# Patient Record
Sex: Male | Born: 1955
Health system: Southern US, Community
[De-identification: ages and names within clinical notes are randomized; demographics above are authoritative.]

## PROBLEM LIST (undated history)

## (undated) ENCOUNTER — Emergency Department (HOSPITAL_COMMUNITY): Admission: EM | Discharge status: Absent without leave | Payer: 59

## (undated) DIAGNOSIS — I10 Essential (primary) hypertension: Secondary | ICD-10-CM

## (undated) DIAGNOSIS — M109 Gout, unspecified: Secondary | ICD-10-CM

## (undated) DIAGNOSIS — T7840XA Allergy, unspecified, initial encounter: Secondary | ICD-10-CM

## (undated) DIAGNOSIS — E785 Hyperlipidemia, unspecified: Secondary | ICD-10-CM

## (undated) DIAGNOSIS — Z950 Presence of cardiac pacemaker: Secondary | ICD-10-CM

## (undated) HISTORY — DX: Gout, unspecified: M10.9

## (undated) HISTORY — PX: KNEE SURGERY: SHX244

## (undated) HISTORY — DX: Allergy, unspecified, initial encounter: T78.40XA

## (undated) HISTORY — DX: Essential (primary) hypertension: I10

## (undated) HISTORY — PX: ROTATOR CUFF REPAIR: SHX139

## (undated) HISTORY — DX: Hyperlipidemia, unspecified: E78.5

---

## 2003-07-20 ENCOUNTER — Emergency Department (HOSPITAL_COMMUNITY): Admission: EM | Admit: 2003-07-20 | Discharge: 2003-07-20 | Payer: Self-pay | Admitting: Emergency Medicine

## 2003-09-27 ENCOUNTER — Ambulatory Visit (HOSPITAL_COMMUNITY): Admission: RE | Admit: 2003-09-27 | Discharge: 2003-09-27 | Payer: Self-pay | Admitting: Orthopedic Surgery

## 2003-10-10 ENCOUNTER — Observation Stay (HOSPITAL_COMMUNITY): Admission: AD | Admit: 2003-10-10 | Discharge: 2003-10-11 | Payer: Self-pay | Admitting: Orthopedic Surgery

## 2008-01-19 HISTORY — PX: COLONOSCOPY: SHX174

## 2008-07-25 ENCOUNTER — Encounter: Payer: Self-pay | Admitting: Gastroenterology

## 2008-08-19 ENCOUNTER — Ambulatory Visit: Payer: Self-pay | Admitting: Gastroenterology

## 2008-09-02 ENCOUNTER — Ambulatory Visit: Payer: Self-pay | Admitting: Gastroenterology

## 2009-12-30 ENCOUNTER — Ambulatory Visit (HOSPITAL_COMMUNITY)
Admission: RE | Admit: 2009-12-30 | Discharge: 2009-12-30 | Payer: Self-pay | Source: Home / Self Care | Attending: Podiatry | Admitting: Podiatry

## 2010-06-05 NOTE — Op Note (Signed)
NAMEADEM, COSTLOW                ACCOUNT NO.:  [id number]   MEDICAL RECORD NO.:  [id number]          PATIENT TYPE:  OBV   LOCATION:  0476                         FACILITY:  [facility]   PHYSICIAN:  Almedia Balls. Ranell Patrick, M.D. DATE OF BIRTH:  1955/06/24   DATE OF PROCEDURE:  10/10/2003  DATE OF DISCHARGE:  10/11/2003                                 OPERATIVE REPORT   PREOPERATIVE DIAGNOSIS:  Left knee hemarthrosis following knee arthroscopy.   POSTOPERATIVE DIAGNOSIS:  Left knee hemarthrosis following knee arthroscopy.   PROCEDURE PERFORMED:  Arthrocentesis of the left knee with evacuation of  hematoma and hemarthrosis followed by placement of closed-suction drainage.   SURGEON:  Almedia Balls. Ranell Patrick, M.D.   ASSISTANT:  None.   ANESTHESIA:  Local plus MAC.   ESTIMATED BLOOD LOSS:  Minimal.   FLUID REPLACEMENT:  Crystalloid 400.   COUNTS:  Correct.   COMPLICATIONS:  None.   Perioperative antibiotics given.   INDICATIONS:  Patient is a 55 year old male status post left knee  arthroscopy for stiffness and adhesions as well as medial meniscal tear.  Patient underwent arthroscopy three days ago.  Patient presents with 24-hour  history of acute knee pain and swelling.  Attempted evacuation in the office  yielded evidence of blood in the joint; however, it seemed to be organizing  and clotting.  It was unable to be evacuated even with an 18 gauge needle.  After counseling the patient and his wife regarding his condition with a  hematoma in his knee and a flexed knee posture, we decided together to  proceed to surgery for an evacuation of his hematoma and placement of a  closed-suction drain.  Informed consent obtained.   DESCRIPTION OF PROCEDURE:  After an adequate level of anesthesia achieved,  the patient was positioned supine on the operating room table.  The left leg  was sterilely prepped and draped in the usual manner.  The superolateral  outflow port was utilized.  We placed a  large, blunt obturator with a scope  cannula into that portal and then evacuated large blood clots from the knee  after thorough evacuation and lavage using saline.  We had instilled  lidocaine, Marcaine, and Neut to assist with postop pain and then placed a  closed-suction drain and sutured the portal close and secured the drain with  a suture as well as a nylon stitch.  Sterile compressive dressing followed  by a knee immobilizer.  Patient taken to the recovery room in stable  condition.      SRN/MEDQ  D:  10/10/2003  T:  10/11/2003  Job:  [id number]

## 2010-06-05 NOTE — H&P (Signed)
Jose Cook, Jose Cook                ACCOUNT NO.:  [id number]   MEDICAL RECORD NO.:  [id number]         PATIENT TYPE:  LOBV   LOCATION:  476                          FACILITY:  [facility]   PHYSICIAN:  Almedia Balls. Ranell Patrick, M.D. DATE OF BIRTH:  09/18/1955   DATE OF ADMISSION:  10/10/2003  DATE OF DISCHARGE:                                HISTORY & PHYSICAL   CHIEF COMPLAINT:  Left knee pain.   HISTORY OF PRESENT ILLNESS:  The patient has extreme left knee pain, status  post a left knee arthroscopy performed several days ago.  He comes into the  office today with excruciating pain and an obvious hemarthrosis and effusion  of the left knee that needs incision and drainage.   ALLERGIES:  No known drug allergies.   MEDICATIONS:  1.  Percocet 5/325 mg.  2.  Robaxin 500 mg.   PAST MEDICAL HISTORY:  Negative.   PAST SURGICAL HISTORY:  Left knee arthroscopy.   FAMILY HISTORY:  Cancer.   REVIEW OF SYSTEMS:  Essentially negative.   PHYSICAL EXAMINATION:  GENERAL:  The patient is a 55 year old healthy-  appearing male in mild distress due to pain, but alert and oriented with a  pleasant mood and affect here in the office today.  Examination of all other  body systems was unremarkable.  EXTREMITIES:  Examination of the left knee shows a moderate hemarthrosis and  moderate effusion, pain with any type of range of motion or palpation.  Neurovascularly, he is intact.  Skin is intact distally.  He is unable to  bear any weight on that left knee.   IMPRESSION:  Left knee hemarthrosis.   PLAN:  Incision and drainage of the left hemarthrosis to be completed later  tonight by Dr. Malon Kindle at [facility].      ________________________________________  Donnie Coffin. Dixon, P.A.  ___________________________________________  Almedia Balls. Ranell Patrick, M.D.    TBD/MEDQ  D:  10/10/2003  T:  10/10/2003  Job:  [id number]

## 2011-01-28 ENCOUNTER — Ambulatory Visit (INDEPENDENT_AMBULATORY_CARE_PROVIDER_SITE_OTHER): Payer: 59

## 2011-01-28 DIAGNOSIS — M109 Gout, unspecified: Secondary | ICD-10-CM

## 2011-08-01 ENCOUNTER — Ambulatory Visit (INDEPENDENT_AMBULATORY_CARE_PROVIDER_SITE_OTHER): Payer: 59 | Admitting: Emergency Medicine

## 2011-08-01 VITALS — BP 138/84 | HR 68 | Temp 98.0°F | Resp 16 | Ht 75.75 in | Wt 226.0 lb

## 2011-08-01 DIAGNOSIS — M109 Gout, unspecified: Secondary | ICD-10-CM

## 2011-08-01 DIAGNOSIS — M79609 Pain in unspecified limb: Secondary | ICD-10-CM

## 2011-08-01 DIAGNOSIS — M79643 Pain in unspecified hand: Secondary | ICD-10-CM

## 2011-08-01 MED ORDER — PREDNISONE 20 MG PO TABS
ORAL_TABLET | ORAL | Status: DC
Start: 2011-08-01 — End: 2012-07-17

## 2011-08-01 MED ORDER — HYDROCODONE-ACETAMINOPHEN 5-325 MG PO TABS: 1.0000 | ORAL_TABLET | Freq: Four times a day (QID) | ORAL | Status: AC | PRN

## 2011-08-01 NOTE — Progress Notes (Signed)
  Subjective:    Patient ID: Jose Cook, male    DOB: November 12, 1955, 56 y.o.   MRN: [id number]  HPI56 year old male presents with left hand pain x 2 days.  Felt discomfort first day and then throughout the night last night, his begin to lose mobility of left hand. Increased swelling. Patient has a long history of gout. He says some chitterlings recently and does occasionally drink beer.     Review of Systems     Objective:   Physical Exam patient holds his hand in ulnar deviation. There is significant swelling that starts at the fifth MCP joint and extends down the ulnar side of his and towards the wrist. There is pain with any movement of the fifth finger        Assessment & Plan:  Patient had a significant flare of his gout. We'll place him in a splint to give him relief then I placed him on prednisone in tapered dose as well as hydrocodone.

## 2011-08-01 NOTE — Patient Instructions (Addendum)
Gout Gout is an inflammatory condition (arthritis) caused by a buildup of uric acid crystals in the joints. Uric acid is a chemical that is normally present in the blood. Under some circumstances, uric acid can form into crystals in your joints. This causes joint redness, soreness, and swelling (inflammation). Repeat attacks are common. Over time, uric acid crystals can form into masses (tophi) near a joint, causing disfigurement. Gout is treatable and often preventable. CAUSES  The disease begins with elevated levels of uric acid in the blood. Uric acid is produced by your body when it breaks down a naturally found substance called purines. This also happens when you eat certain foods such as meats and fish. Causes of an elevated uric acid level include:  Being passed down from parent to child (heredity).   Diseases that cause increased uric acid production (obesity, psoriasis, some cancers).   Excessive alcohol use.   Diet, especially diets rich in meat and seafood.   Medicines, including certain cancer-fighting drugs (chemotherapy), diuretics, and aspirin.   Chronic kidney disease. The kidneys are no longer able to remove uric acid well.   Problems with metabolism.  Conditions strongly associated with gout include:  Obesity.   High blood pressure.   High cholesterol.   Diabetes.  Not everyone with elevated uric acid levels gets gout. It is not understood why some people get gout and others do not. Surgery, joint injury, and eating too much of certain foods are some of the factors that can lead to gout. SYMPTOMS   An attack of gout comes on quickly. It causes intense pain with redness, swelling, and warmth in a joint.   Fever can occur.   Often, only one joint is involved. Certain joints are more commonly involved:   Base of the big toe.   Knee.   Ankle.   Wrist.   Finger.  Without treatment, an attack usually goes away in a few days to weeks. Between attacks, you  usually will not have symptoms, which is different from many other forms of arthritis. DIAGNOSIS  Your caregiver will suspect gout based on your symptoms and exam. Removal of fluid from the joint (arthrocentesis) is done to check for uric acid crystals. Your caregiver will give you a medicine that numbs the area (local anesthetic) and use a needle to remove joint fluid for exam. Gout is confirmed when uric acid crystals are seen in joint fluid, using a special microscope. Sometimes, blood, urine, and X-ray tests are also used. TREATMENT  There are 2 phases to gout treatment: treating the sudden onset (acute) attack and preventing attacks (prophylaxis). Treatment of an Acute Attack  Medicines are used. These include anti-inflammatory medicines or steroid medicines.   An injection of steroid medicine into the affected joint is sometimes necessary.   The painful joint is rested. Movement can worsen the arthritis.   You may use warm or cold treatments on painful joints, depending which works best for you.   Discuss the use of coffee, vitamin C, or cherries with your caregiver. These may be helpful treatment options.  Treatment to Prevent Attacks After the acute attack subsides, your caregiver may advise prophylactic medicine. These medicines either help your kidneys eliminate uric acid from your body or decrease your uric acid production. You may need to stay on these medicines for a very long time. The early phase of treatment with prophylactic medicine can be associated with an increase in acute gout attacks. For this reason, during the first few months   of treatment, your caregiver may also advise you to take medicines usually used for acute gout treatment. Be sure you understand your caregiver's directions. You should also discuss dietary treatment with your caregiver. Certain foods such as meats and fish can increase uric acid levels. Other foods such as dairy can decrease levels. Your caregiver  can give you a list of foods to avoid. HOME CARE INSTRUCTIONS   Do not take aspirin to relieve pain. This raises uric acid levels.   Only take over-the-counter or prescription medicines for pain, discomfort, or fever as directed by your caregiver.   Rest the joint as much as possible. When in bed, keep sheets and blankets off painful areas.   Keep the affected joint raised (elevated).   Use crutches if the painful joint is in your leg.   Drink enough water and fluids to keep your urine clear or pale yellow. This helps your body get rid of uric acid. Do not drink alcoholic beverages. They slow the passage of uric acid.   Follow your caregiver's dietary instructions. Pay careful attention to the amount of protein you eat. Your daily diet should emphasize fruits, vegetables, whole grains, and fat-free or low-fat milk products.   Maintain a healthy body weight.  SEEK MEDICAL CARE IF:   You have an oral temperature above 102 F (38.9 C).   You develop diarrhea, vomiting, or any side effects from medicines.   You do not feel better in 24 hours, or you are getting worse.  SEEK IMMEDIATE MEDICAL CARE IF:   Your joint becomes suddenly more tender and you have:   Chills.   An oral temperature above 102 F (38.9 C), not controlled by medicine.  MAKE SURE YOU:   Understand these instructions.   Will watch your condition.   Will get help right away if you are not doing well or get worse.  Document Released: 01/02/2000 Document Revised: 12/24/2010 Document Reviewed: 04/14/2009 Exit[employer] Patient Information 2012 [employer]. 

## 2012-07-17 ENCOUNTER — Ambulatory Visit (INDEPENDENT_AMBULATORY_CARE_PROVIDER_SITE_OTHER): Payer: 59 | Admitting: Emergency Medicine

## 2012-07-17 VITALS — BP 140/90 | HR 70 | Temp 98.0°F | Resp 16 | Ht 76.0 in | Wt 225.8 lb

## 2012-07-17 DIAGNOSIS — M109 Gout, unspecified: Secondary | ICD-10-CM

## 2012-07-17 LAB — BASIC METABOLIC PANEL
Calcium: 9 mg/dL (ref 8.4–10.5)
Glucose, Bld: 88 mg/dL (ref 70–99)
Potassium: 3.8 mEq/L (ref 3.5–5.3)
Sodium: 140 mEq/L (ref 135–145)

## 2012-07-17 LAB — POCT CBC
Granulocyte percent: 54.9 %G (ref 37–80)
HCT, POC: 43.2 % — AB (ref 43.5–53.7)
Lymph, poc: 2.4 (ref 0.6–3.4)
MCHC: 31.9 g/dL (ref 31.8–35.4)
MPV: 9.2 fL (ref 0–99.8)
POC Granulocyte: 3.6 (ref 2–6.9)
POC LYMPH PERCENT: 37.1 %L (ref 10–50)
POC MID %: 8 %M (ref 0–12)
Platelet Count, POC: 280 10*3/uL (ref 142–424)
RDW, POC: 13.1 %

## 2012-07-17 LAB — URIC ACID: Uric Acid, Serum: 8.2 mg/dL — ABNORMAL HIGH (ref 4.0–7.8)

## 2012-07-17 MED ORDER — HYDROCODONE-ACETAMINOPHEN 5-325 MG PO TABS: 1.0000 | ORAL_TABLET | Freq: Four times a day (QID) | ORAL | Status: DC | PRN

## 2012-07-17 MED ORDER — PREDNISONE 20 MG PO TABS: ORAL_TABLET | ORAL | Status: DC

## 2012-07-17 NOTE — Progress Notes (Signed)
  Subjective:    Patient ID: Jose Cook, male    DOB: 09/29/1955, 57 y.o.   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 There is swelling over the distal foot. This area is warm and tender to touch. There is pain with flexion extension of the great toe  No results found for this or any previous visit. Results for orders placed in visit on 07/17/12  POCT CBC      Result Value Range   WBC 6.5  4.6 - 10.2 K/uL   Lymph, poc 2.4  0.6 - 3.4   POC LYMPH PERCENT 37.1  10 - 50 %L   MID (cbc) 0.5  0 - 0.9   POC MID % 8.0  0 - 12 %M   POC Granulocyte 3.6  2 - 6.9   Granulocyte percent 54.9  37 - 80 %G   RBC 4.66 (*) 4.69 - 6.13 M/uL   Hemoglobin 13.8 (*) 14.1 - 18.1 g/dL   HCT, POC 78.2 (*) 95.6 - 53.7 %   MCV 92.7  80 - 97 fL   MCH, POC 29.6  27 - 31.2 pg   MCHC 31.9  31.8 - 35.4 g/dL   RDW, POC 21.3     Platelet Count, POC 280  142 - 424 K/uL   MPV 9.2  0 - 99.8 fL       Assessment & Plan:  Patient here with an acute gout flare. He has severe pain and swelling of the first MTP joint. We'll treat with prednisone and pain medication and take out of work. I do think he would be a good candidate to see one of the rheumatologists and get help for this recurrent problem. Prednisone called in.

## 2012-07-17 NOTE — Patient Instructions (Signed)
Gout  Gout is an inflammatory condition (arthritis) caused by a buildup of uric acid crystals in the joints. Uric acid is a chemical that is normally present in the blood. Under some circumstances, uric acid can form into crystals in your joints. This causes joint redness, soreness, and swelling (inflammation). Repeat attacks are common. Over time, uric acid crystals can form into masses (tophi) near a joint, causing disfigurement. Gout is treatable and often preventable.  CAUSES   The disease begins with elevated levels of uric acid in the blood. Uric acid is produced by your body when it breaks down a naturally found substance called purines. This also happens when you eat certain foods such as meats and fish. Causes of an elevated uric acid level include:   Being passed down from parent to child (heredity).   Diseases that cause increased uric acid production (obesity, psoriasis, some cancers).   Excessive alcohol use.   Diet, especially diets rich in meat and seafood.   Medicines, including certain cancer-fighting drugs (chemotherapy), diuretics, and aspirin.   Chronic kidney disease. The kidneys are no longer able to remove uric acid well.   Problems with metabolism.  Conditions strongly associated with gout include:   Obesity.   High blood pressure.   High cholesterol.   Diabetes.  Not everyone with elevated uric acid levels gets gout. It is not understood why some people get gout and others do not. Surgery, joint injury, and eating too much of certain foods are some of the factors that can lead to gout.  SYMPTOMS    An attack of gout comes on quickly. It causes intense pain with redness, swelling, and warmth in a joint.   Fever can occur.   Often, only one joint is involved. Certain joints are more commonly involved:   Base of the big toe.   Knee.   Ankle.   Wrist.   Finger.  Without treatment, an attack usually goes away in a few days to weeks. Between attacks, you usually will not have  symptoms, which is different from many other forms of arthritis.  DIAGNOSIS   Your caregiver will suspect gout based on your symptoms and exam. Removal of fluid from the joint (arthrocentesis) is done to check for uric acid crystals. Your caregiver will give you a medicine that numbs the area (local anesthetic) and use a needle to remove joint fluid for exam. Gout is confirmed when uric acid crystals are seen in joint fluid, using a special microscope. Sometimes, blood, urine, and X-ray tests are also used.  TREATMENT   There are 2 phases to gout treatment: treating the sudden onset (acute) attack and preventing attacks (prophylaxis).  Treatment of an Acute Attack   Medicines are used. These include anti-inflammatory medicines or steroid medicines.   An injection of steroid medicine into the affected joint is sometimes necessary.   The painful joint is rested. Movement can worsen the arthritis.   You may use warm or cold treatments on painful joints, depending which works best for you.   Discuss the use of coffee, vitamin C, or cherries with your caregiver. These may be helpful treatment options.  Treatment to Prevent Attacks  After the acute attack subsides, your caregiver may advise prophylactic medicine. These medicines either help your kidneys eliminate uric acid from your body or decrease your uric acid production. You may need to stay on these medicines for a very long time.  The early phase of treatment with prophylactic medicine can be associated   with an increase in acute gout attacks. For this reason, during the first few months of treatment, your caregiver may also advise you to take medicines usually used for acute gout treatment. Be sure you understand your caregiver's directions.  You should also discuss dietary treatment with your caregiver. Certain foods such as meats and fish can increase uric acid levels. Other foods such as dairy can decrease levels. Your caregiver can give you a list of foods  to avoid.  HOME CARE INSTRUCTIONS    Do not take aspirin to relieve pain. This raises uric acid levels.   Only take over-the-counter or prescription medicines for pain, discomfort, or fever as directed by your caregiver.   Rest the joint as much as possible. When in bed, keep sheets and blankets off painful areas.   Keep the affected joint raised (elevated).   Use crutches if the painful joint is in your leg.   Drink enough water and fluids to keep your urine clear or pale yellow. This helps your body get rid of uric acid. Do not drink alcoholic beverages. They slow the passage of uric acid.   Follow your caregiver's dietary instructions. Pay careful attention to the amount of protein you eat. Your daily diet should emphasize fruits, vegetables, whole grains, and fat-free or low-fat milk products.   Maintain a healthy body weight.  SEEK MEDICAL CARE IF:    You have an oral temperature above 102 F (38.9 C).   You develop diarrhea, vomiting, or any side effects from medicines.   You do not feel better in 24 hours, or you are getting worse.  SEEK IMMEDIATE MEDICAL CARE IF:    Your joint becomes suddenly more tender and you have:   Chills.   An oral temperature above 102 F (38.9 C), not controlled by medicine.  MAKE SURE YOU:    Understand these instructions.   Will watch your condition.   Will get help right away if you are not doing well or get worse.  Document Released: 01/02/2000 Document Revised: 03/29/2011 Document Reviewed: 04/14/2009  [employer] Patient Information 2014 [employer].

## 2012-07-19 ENCOUNTER — Telehealth: Payer: Self-pay

## 2012-07-19 NOTE — Telephone Encounter (Signed)
Faxed labs to [facility].

## 2012-07-19 NOTE — Telephone Encounter (Signed)
[facility] NEEDS THE LABS FAXED  TO  [phone] WE REFERRED PATIENT AND NEEDED FOR APPOINTMENT SCHEDULING  ATTN:  OMEGA  Cbn:  [phone]

## 2012-07-30 ENCOUNTER — Ambulatory Visit (INDEPENDENT_AMBULATORY_CARE_PROVIDER_SITE_OTHER): Payer: 59 | Admitting: Family Medicine

## 2012-07-30 VITALS — BP 146/85 | HR 79 | Temp 98.5°F | Resp 18 | Wt 225.0 lb

## 2012-07-30 DIAGNOSIS — H10532 Contact blepharoconjunctivitis, left eye: Secondary | ICD-10-CM

## 2012-07-30 DIAGNOSIS — H10539 Contact blepharoconjunctivitis, unspecified eye: Secondary | ICD-10-CM

## 2012-07-30 MED ORDER — GATIFLOXACIN 0.3 % OP SOLN: 1.0000 [drp] | Freq: Four times a day (QID) | OPHTHALMIC | Status: DC

## 2012-07-30 NOTE — Progress Notes (Signed)
Subjective:    Patient ID: Jose Cook, male    DOB: January 12, 1956, 57 y.o.   MRN: [id number] Chief Complaint  Patient presents with  . Eye Pain    left   HPI  Left eye turned red yesterday and vision has gotten worse in left eye, not draining anything.  Just got off of a 12-hr shift - works o/n.  It is painful, no itching, but left eye is burning, + photophobia.  No h/o any eye problems other than one similar episode that he had prior from wearing contacts to long.  Is wearing his contacts now and does not have his glasses with him. Has to drive home and does not have anyone who could come pick him up. Supposed to change his contacts every 2 weeks - is due for f/u eye doctor appt and was going to schedule this week so he wore his last pair for 3 wks and then changed them 2d ago after which this redness and pain began.  Has been wearing this left lens for the past 2d straight (though is supposed to take them out at night and hasn't.) Does not remember the name of his eye doctor or the practice but has it at home.  History reviewed. No pertinent past medical history. Current Outpatient Prescriptions on File Prior to Visit  Medication Sig Dispense Refill  . HYDROcodone-acetaminophen (NORCO) 5-325 MG per tablet Take 1 tablet by mouth every 6 (six) hours as needed for pain.  20 tablet  0  . indomethacin (INDOCIN) 50 MG capsule Take 50 mg by mouth. Pt not sure of what dose he is taking      . predniSONE (DELTASONE) 20 MG tablet Take 3 a day for 3 days 2 a day for 3 days one a day for 3 days  18 tablet  0   No current facility-administered medications on file prior to visit.   No Known Allergies  Review of Systems  Constitutional: Negative for fever, chills, diaphoresis and fatigue.  HENT: Negative for ear pain, congestion, sore throat, rhinorrhea, sneezing, postnasal drip, sinus pressure and ear discharge.   Eyes: Positive for photophobia, pain, redness and visual disturbance. Negative for  discharge and itching.  Skin: Negative for rash and wound.  Neurological: Positive for headaches. Negative for dizziness.  Hematological: Negative for adenopathy.  Psychiatric/Behavioral: Negative for sleep disturbance.      BP 146/85  Pulse 79  Temp(Src) 98.5 F (36.9 C) (Oral)  Resp 18  Wt 225 lb (102.059 kg)  BMI 27.4 kg/m2 Objective:   Physical Exam  Constitutional: He is oriented to person, place, and time. He appears well-developed and well-nourished. No distress.  HENT:  Head: Normocephalic and atraumatic.  Eyes: Pupils are equal, round, and reactive to light. Right eye exhibits no chemosis, no discharge, no exudate and no hordeolum. No foreign body present in the right eye. Left eye exhibits chemosis and discharge. Left eye exhibits no exudate. Right conjunctiva is not injected. Right conjunctiva has no hemorrhage. Left conjunctiva is injected. Left conjunctiva has no hemorrhage. No scleral icterus. Right eye exhibits normal extraocular motion and no nystagmus. Left eye exhibits normal extraocular motion and no nystagmus. Right pupil is round and reactive. Left pupil is round and reactive. Pupils are equal.  Fundoscopic exam:      The right eye shows no arteriolar narrowing, no AV nicking, no exudate, no hemorrhage and no papilledema.       The left eye shows no arteriolar narrowing, no  AV nicking, no exudate, no hemorrhage and no papilledema.  Small amount of watery discharge on left.  Eye injected diffusely over entire conjunctiva.  Pulmonary/Chest: Effort normal.  Neurological: He is alert and oriented to person, place, and time.  Skin: Skin is warm and dry. He is not diaphoretic.  Psychiatric: He has a normal mood and affect. His behavior is normal.      Assessment & Plan:  Contact blepharoconjunctivitis of left eye - Take contact out as soon as he gets home and throw away. Glasses only. Start eye gtts today and f/u w/ optho tomorrow for slit lap exam.   Had second eval by  Dr. Merla Riches as well while pt was in the office who agreed w/ plan.   Meds ordered this encounter  Medications  . gatifloxacin (ZYMAR) 0.3 % ophthalmic drops    Sig: Place 1 drop into the left eye 4 (four) times daily.    Dispense:  5 mL    Refill:  0

## 2012-08-07 ENCOUNTER — Other Ambulatory Visit: Payer: Self-pay | Admitting: Physician Assistant

## 2012-08-07 ENCOUNTER — Ambulatory Visit (INDEPENDENT_AMBULATORY_CARE_PROVIDER_SITE_OTHER): Payer: 59 | Admitting: Physician Assistant

## 2012-08-07 VITALS — BP 166/100 | HR 88 | Temp 98.1°F | Resp 18 | Ht 76.0 in | Wt 225.0 lb

## 2012-08-07 DIAGNOSIS — M25572 Pain in left ankle and joints of left foot: Secondary | ICD-10-CM

## 2012-08-07 DIAGNOSIS — M25579 Pain in unspecified ankle and joints of unspecified foot: Secondary | ICD-10-CM

## 2012-08-07 DIAGNOSIS — M109 Gout, unspecified: Secondary | ICD-10-CM

## 2012-08-07 LAB — BASIC METABOLIC PANEL WITH GFR
CO2: 28 mEq/L (ref 19–32)
Chloride: 106 mEq/L (ref 96–112)
Creat: 1.05 mg/dL (ref 0.50–1.35)
GFR, Est Non African American: 80 mL/min
Potassium: 4.2 mEq/L (ref 3.5–5.3)
Sodium: 141 mEq/L (ref 135–145)

## 2012-08-07 LAB — URIC ACID: Uric Acid, Serum: 8.5 mg/dL — ABNORMAL HIGH (ref 4.0–7.8)

## 2012-08-07 NOTE — Progress Notes (Signed)
Patient ID: Jose Cook MRN: [id number], DOB: 1955-02-18, 57 y.o. Date of Encounter: 08/07/2012, 10:53 AM  Primary Physician: No PCP Per Patient  Chief Complaint: Gout left ankle for 2 days  HPI: 57 y.o. male with history below presents with worsening pain along the left ankle for 2 days. Patient with a long history of gout for many years. Recently seen for gout attack involving the right ankle, this has since resolved. No injury or trauma. No radiation distally or proximally. Afebrile. No chills. He did recently eat some pork. Notes increasing pain with ambulation. Has an appointment with rheumatology in 8 days. Does not tolerate allopurinol or Uloric for ULT. Did not tolerate Colcrys.   Blood pressure usually runs around 140 over 80-90. He did not sleep the previous night and is in increasing amounts of pain currently.      Past Medical History  Diagnosis Date  . Gout   . HTN (hypertension)      Home Meds: Prior to Admission medications   Medication Sig Start Date End Date Taking? Authorizing Provider  HYDROcodone-acetaminophen (NORCO) 5-325 MG per tablet Take 1 tablet by mouth every 6 (six) hours as needed for pain. 07/17/12  Yes Collene Gobble, MD  gatifloxacin (ZYMAR) 0.3 % ophthalmic drops Place 1 drop into the left eye 4 (four) times daily. 07/30/12   Sherren Mocha, MD    Allergies: No Known Allergies  History   Social History  . Marital Status: Married    Spouse Name: N/A    Number of Children: N/A  . Years of Education: N/A   Occupational History  . Not on file.   Social History Main Topics  . Smoking status: Current Every Day Smoker -- 20 years  . Smokeless tobacco: Not on file  . Alcohol Use: Yes  . Drug Use: No  . Sexually Active: Yes   Other Topics Concern  . Not on file   Social History Narrative  . No narrative on file     Review of Systems: Constitutional: negative for chills or fever  HEENT: negative for vision changes or hearing  loss Cardiovascular: negative for chest pain or palpitations Respiratory: negative for wheezing, shortness of breath, or cough Abdominal: negative for abdominal pain, nausea, or vomiting Dermatological: see above   Physical Exam: Blood pressure 166/100, pulse 88, temperature 98.1 F (36.7 C), temperature source Oral, resp. rate 18, height 6\' 4"  (1.93 m), weight 225 lb (102.059 kg), SpO2 98.00%., Body mass index is 27.4 kg/(m^2). General: Well developed, well nourished, in no acute distress. Head: Normocephalic, atraumatic, eyes without discharge, sclera non-icteric, nares are without discharge.   Neck: Supple. Full ROM.  Lungs: Breathing is unlabored. Heart: Regular rate. Msk:  Strength and tone normal for age. Extremities/Skin: Left ankle mildly erythematous and swollen with TTP. Distal pulses 2+ and cap refill less than 2 seconds through out. Warm and dry. No clubbing or cyanosis. No edema. No rashes, wounds, or suspicious lesions. Flexion and extension intact. Sensation intact.  Neuro: Alert and oriented X 3. Moves all extremities spontaneously. Gait is normal. CNII-XII grossly in tact. Psych:  Responds to questions appropriately with a normal affect.   Labs: Uric acid and BMP pending.  ASSESSMENT AND PLAN:  57 y.o. year old male with gout of left ankle. -Prednisone 20 mg #18 3x3, 2x3, 1x3 no RF -Norco 5/325 mg 1 po q 4-6 hours prn pain #30 no RF, SED -Await labs -Diet modifications discussed  -OOW through 08/08/12 -Follow  up with Rheumatology  -Monitor blood pressure -RTC precautions  Signed, Eula Listen, PA-C 08/07/2012 10:53 AM

## 2012-12-30 ENCOUNTER — Ambulatory Visit: Payer: 59

## 2012-12-30 ENCOUNTER — Ambulatory Visit (INDEPENDENT_AMBULATORY_CARE_PROVIDER_SITE_OTHER): Payer: 59 | Admitting: Family Medicine

## 2012-12-30 VITALS — BP 142/82 | HR 82 | Temp 98.2°F | Resp 16 | Ht 76.0 in | Wt 228.0 lb

## 2012-12-30 DIAGNOSIS — L089 Local infection of the skin and subcutaneous tissue, unspecified: Secondary | ICD-10-CM

## 2012-12-30 LAB — POCT CBC
Granulocyte percent: 55.2 %G (ref 37–80)
HCT, POC: 45.6 % (ref 43.5–53.7)
MCH, POC: 29.3 pg (ref 27–31.2)
MCV: 93.4 fL (ref 80–97)
MID (cbc): 0.5 (ref 0–0.9)
POC LYMPH PERCENT: 39 %L (ref 10–50)
Platelet Count, POC: 218 10*3/uL (ref 142–424)
RDW, POC: 13.8 %
WBC: 8 10*3/uL (ref 4.6–10.2)

## 2012-12-30 MED ORDER — OXYCODONE-ACETAMINOPHEN 5-325 MG PO TABS: 1.0000 | ORAL_TABLET | Freq: Three times a day (TID) | ORAL | Status: DC | PRN

## 2012-12-30 MED ORDER — DOXYCYCLINE HYCLATE 100 MG PO CAPS: 100.0000 mg | ORAL_CAPSULE | Freq: Two times a day (BID) | ORAL | Status: DC

## 2012-12-30 MED ORDER — MUPIROCIN 2 % EX OINT: 1.0000 "application " | TOPICAL_OINTMENT | Freq: Three times a day (TID) | CUTANEOUS | Status: DC

## 2012-12-30 NOTE — Patient Instructions (Signed)
Start doing warm water soaks with some antibacterial medicine in it such as witchhazel, iodine, or hydrogen peroxide. RTC immed for any increased pain, redness, swelling, or purulent drainage. Recheck 48 hrs, sooner if worse.  Abscess An abscess is an infected area that contains a collection of pus and debris.It can occur in almost any part of the body. An abscess is also known as a furuncle or boil. CAUSES  An abscess occurs when tissue gets infected. This can occur from blockage of oil or sweat glands, infection of hair follicles, or a minor injury to the skin. As the body tries to fight the infection, pus collects in the area and creates pressure under the skin. This pressure causes pain. People with weakened immune systems have difficulty fighting infections and get certain abscesses more often.  SYMPTOMS Usually an abscess develops on the skin and becomes a painful mass that is red, warm, and tender. If the abscess forms under the skin, you may feel a moveable soft area under the skin. Some abscesses break open (rupture) on their own, but most will continue to get worse without care. The infection can spread deeper into the body and eventually into the bloodstream, causing you to feel ill.  DIAGNOSIS  Your caregiver will take your medical history and perform a physical exam. A sample of fluid may also be taken from the abscess to determine what is causing your infection. TREATMENT  Your caregiver may prescribe antibiotic medicines to fight the infection. However, taking antibiotics alone usually does not cure an abscess. Your caregiver may need to make a small cut (incision) in the abscess to drain the pus. In some cases, gauze is packed into the abscess to reduce pain and to continue draining the area. HOME CARE INSTRUCTIONS   Only take over-the-counter or prescription medicines for pain, discomfort, or fever as directed by your caregiver.  If you were prescribed antibiotics, take them as  directed. Finish them even if you start to feel better.  If gauze is used, follow your caregiver's directions for changing the gauze.  To avoid spreading the infection:  Keep your draining abscess covered with a bandage.  Wash your hands well.  Do not share personal care items, towels, or whirlpools with others.  Avoid skin contact with others.  Keep your skin and clothes clean around the abscess.  Keep all follow-up appointments as directed by your caregiver. SEEK MEDICAL CARE IF:   You have increased pain, swelling, redness, fluid drainage, or bleeding.  You have muscle aches, chills, or a general ill feeling.  You have a fever. MAKE SURE YOU:   Understand these instructions.  Will watch your condition.  Will get help right away if you are not doing well or get worse. Document Released: 10/14/2004 Document Revised: 07/06/2011 Document Reviewed: 03/19/2011 [facility] Patient Information 2014 [place], [place]. Bone and Joint Infections Joint infections are called septic or infectious arthritis. An infected joint may damage cartilage and tissue very quickly. This may destroy the joint. Bone infections (osteomyelitis) may last for years. Joints may become stiff if left untreated. Bacteria are the most common cause. Other causes include viruses and fungi, but these are more rare. Bone and joint infections usually come from injury or infection elsewhere in your body; the germs are carried to your bones or joints through the bloodstream.  CAUSES   Blood-carried germs from an infection elsewhere in your body can eventually spread to a bone or joint. The germ staphylococcus is the most common cause  of both osteomyelitis and septic arthritis.  An injury can introduce germs into your bones or joints. SYMPTOMS   Weight loss.  Tiredness.  Chills and fever.  Bone or joint pain at rest and with activity.  Tenderness when touching the area or bending the joint.  Refusal to bear  weight on a leg or inability to use an arm due to pain.  Decreased range of motion in a joint.  Skin redness, warmth, and tenderness.  Open skin sores and drainage. RISK FACTORS Children, the elderly, and those with weak immune systems are at increased risk of bone and joint infections. It is more common in people with HIV infections and with people on chemotherapy. People are also at increased risk if they have surgery where metal implants are used to stabilize the bone. Plates, screws, or artificial joints provide a surface that bacteria can stick on. Such a growth of bacteria is called biofilm. The biofilm protects bacteria from antibiotics and bodily defenses. This allows germs to multiply. Other reasons for increased risks include:   Having previous surgery or injury of a bone or joint.  Being on high-dose corticosteroids and immunosuppressive medications that weaken your body's resistance to germs.  Diabetes and long-standing diseases.  Use of intravenous street drugs.  Being on hemodialysis.  Having a history of urinary tract infections.  Removal of your spleen (splenectomy). This weakens your immunity.  Chronic viral infections such as HIV or AIDS.  Lack of sensation such as paraplegia, quadriplegia, or spina bifida. DIAGNOSIS   Increased numbers of white blood cells in your blood may indicate infection. Some times your caregivers are able to identify the infecting germs by testing your blood. Inflammatory markers present in your bloodstream such as an erythrocyte sedimentation rate (ESR or sed rate) or c-reactive protein (CRP) can be indicators of deep infection.  Bone scans and X-ray exams are necessary for diagnosing osteomyelitis. They may help your caregiver find the infected areas. Other studies may give more detailed information. They may help detect fluid collections around a joint, abnormal bone surfaces, or be useful in diagnosing septic arthritis. They can find soft  tissue swelling and find excess fluid in an infected joint or the adjacent bone. These tests include:  Ultrasound.  CT (computerized tomography).  MRI (magnetic resonance imaging).  The best test for diagnosing a bone or joint infection is an aspiration or biopsy. Your caregiver will usually use a local anesthetic. He or she can then remove tissue from a bone injury or use a needle to take fluids from an infected joint. A local anesthetic medication numbs the area to be biopsied. Often biopsies are done in the operating room under general anesthesia. This means you will be asleep during the procedure. Tests performed on these samples can identify an infection. TREATMENT   Treatment can help control long-standing infections, but infections may come back.  Infections can infect any bone or joint at any age.  Bone and joint infections are rarely fatal.  Bone infection left untreated can become a never-ending infection. It can spread to other areas of your body. It may eventually cause bone death. Reduced limb or joint function can result. In severe cases, this may require removal of a limb. Spinal osteomyelitis is very dangerous. Untreated, it may damage spinal nerves and cause death.  The most common complication of septic arthritis is osteoarthritis with pain and decreased range of motion of the joint. Some forms of treatment may include:  If the infection is  caused by bacteria, it is generally treated with antibiotics. You will likely receive the drugs through a vein (intravenously) for anywhere from 2 to 6 weeks. In some cases, especially with children, oral antibiotics following an initial intravenous dose may be effective. The treatment you receive depends on the:  Type of bacteria.  Location of the infection.  Type of surgery that might be done.  Other health conditions or issues you might have.  Your caregiver may drain soft tissue abscesses or pockets of fluid around infected  bones or joints. If you have septic arthritis, your caregiver may use a needle to drain pus from the joint on a daily basis. He or she may use an arthroscope to clean the joint or may need to open the joint surgically to remove damaged tissue and infection. An arthroscope is an instrument like a thin lighted telescope. It can be used to look inside the joint.  Surgery is usually needed if the infection has become long-standing. It may also be needed if there is hardware (such as metal plates, screws, or artificial joints) inside the patient. Sometimes a bone or muscle graft is needed to fill in the open space. This promotes growth of new tissues and better blood flow to the area. PREVENTION   Clean and disinfect wounds quickly to help prevent the start of a bone or joint infection. Get treatment for any infections to prevent spread to a bone or joint.  Do not smoke. Smoking decreases healing rates of bone and predisposes to infection.  When given medications that suppress your immune system, use them according to your caregiver's instructions. Do not take more than prescribed for your condition.  Take good care of your feet and skin, especially if you have diabetes, decreased sensation or circulation problems. SEEK IMMEDIATE MEDICAL CARE IF:   You cannot bear weight on a leg or use an arm, especially following a minor injury. This can be a sign of bone or joint infection.  You think you may have signs or symptoms of a bone or joint infection. Your chance of getting rid of an infection is better if treated early. Document Released: 01/04/2005 Document Revised: 03/29/2011 Document Reviewed: 12/04/2008 [facility] Patient Information 2014 [place], [place].

## 2012-12-30 NOTE — Progress Notes (Addendum)
Subjective:  This chart was scribed for Norberto Sorenson, MD at [facility] by Yevette Edwards, Scribe. This pt's care was started at 4:21 PM.   Patient ID: Jose Cook, male    DOB: 12/03/1955, 57 y.o.   MRN: [id number]  Chief Complaint  Patient presents with  . Foot Pain    right foot ; has trouble with bunion, has had drainnage    HPI Jose Cook is a 57 y.o. male, with a h/o gout and HTN, who presents to [facility] complaining of gradually-increasing pain associated with a bunion to his right foot which began approximately three weeks ago, but which increased in severity this morning. He characterizes the pain associated with the bunion as "stinging and burning." He has experienced swelling, redness, and drainage from the affected site. The pt states the pain is not similar to prior episodes of gout.   The nurse at the pt's work today recommended he have medical treatment for his foot. 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    Past Medical History  Diagnosis Date  . Gout   . HTN (hypertension)    Current Outpatient Prescriptions on File Prior to Visit  Medication Sig Dispense Refill  . gatifloxacin (ZYMAR) 0.3 % ophthalmic drops Place 1 drop into the left eye 4 (four) times daily.  5 mL  0  . HYDROcodone-acetaminophen (NORCO) 5-325 MG per tablet Take 1 tablet by mouth every 6 (six) hours as needed for pain.  20 tablet  0   No current facility-administered medications on file prior to visit.   No Known Allergies   Review of Systems  Constitutional: Positive for activity change. Negative for fever, chills and diaphoresis.  Cardiovascular: Negative for leg swelling.  Musculoskeletal: Positive for arthralgias, gait problem, joint swelling and myalgias.  Skin: Positive for color change and wound. Negative for pallor.  Hematological: Negative for adenopathy. Does not bruise/bleed easily.    Psychiatric/Behavioral: Negative for sleep disturbance.    BP 142/82  Pulse 82  Temp(Src) 98.2 F (36.8 C) (Oral)  Resp 16  Ht 6\' 4"  (1.93 m)  Wt 228 lb (103.42 kg)  BMI 27.76 kg/m2  SpO2 97%    Objective:   Physical Exam  Nursing note and vitals reviewed. Constitutional: He is oriented to person, place, and time. He appears well-developed and well-nourished. No distress.  HENT:  Head: Normocephalic and atraumatic.  Eyes: EOM are normal.  Neck: Neck supple. No tracheal deviation present.  Cardiovascular: Normal rate and intact distal pulses.   Pulses:      Dorsalis pedis pulses are 2+ on the right side, and 2+ on the left side.       Posterior tibial pulses are 2+ on the right side, and 2+ on the left side.  Pulmonary/Chest: Effort normal. No respiratory distress.  Musculoskeletal: Normal range of motion. He exhibits tenderness.  Bunion deformity of the MTP joint with reduced ROM of joint and overlying fluctuance with erythema, tenderness, and pustular drainage.   Neurological: He is alert and oriented to person, place, and time.  Skin: Skin is warm and dry.  Psychiatric: He has a normal mood and affect. His behavior is normal.   4:47 PM- Right foot X-ray interpreted by Dr. Norberto Sorenson. Bunion deformity with some bony irregularity in distal first metatarsal.    EXAM: RIGHT FOOT COMPLETE - 3+ VIEW  COMPARISON: None.  FINDINGS: Hallux valgus. No  fracture or dislocation. Soft tissues unremarkable.  IMPRESSION: Hallux valgus deformity.   Assessment & Plan:   4:17 PM- Discussed treatment plan with patient, and the patient agreed to the plan. Informed the pt to take the prescribed antibiotic, Doxycycline, twice a day. And informed him to soak his foot in warm water with an antibacterial rinse such as Iodine or Witch Hazel consistently. After soaking the foot, he is to apply an antibiotic cream. Pt needs to be rechecked in two days on Monday.   The pt does not have an  orthopedist or podiatrist. Informed him to consult a podiatrist early next week.  He has an appointment with a rheumatologist.   Right foot infection - Plan: [facility] Complete Right, POCT CBC, Wound culture, Ambulatory referral to Podiatry  Meds ordered this encounter  Medications  . colchicine (COLCRYS) 0.6 MG tablet    Sig: Take 0.6 mg by mouth daily.  Marland Kitchen allopurinol (ZYLOPRIM) 100 MG tablet    Sig: Take 100 mg by mouth daily.  Marland Kitchen oxyCODONE-acetaminophen (ROXICET) 5-325 MG per tablet    Sig: Take 1 tablet by mouth every 8 (eight) hours as needed for severe pain.    Dispense:  20 tablet    Refill:  0  . doxycycline (VIBRAMYCIN) 100 MG capsule    Sig: Take 1 capsule (100 mg total) by mouth 2 (two) times daily.    Dispense:  20 capsule    Refill:  0  . mupirocin ointment (BACTROBAN) 2 %    Sig: Apply 1 application topically 3 (three) times daily.    Dispense:  22 g    Refill:  1    I personally performed the services described in this documentation, which was scribed in my presence. The recorded information has been reviewed and considered, and addended by me as needed.  Norberto Sorenson, MD MPH

## 2013-01-01 ENCOUNTER — Ambulatory Visit (INDEPENDENT_AMBULATORY_CARE_PROVIDER_SITE_OTHER): Payer: 59 | Admitting: Family Medicine

## 2013-01-01 VITALS — BP 150/94 | HR 76 | Temp 98.0°F | Resp 16 | Ht 76.5 in | Wt 231.0 lb

## 2013-01-01 DIAGNOSIS — L03115 Cellulitis of right lower limb: Secondary | ICD-10-CM

## 2013-01-01 DIAGNOSIS — M21611 Bunion of right foot: Secondary | ICD-10-CM

## 2013-01-01 DIAGNOSIS — M21619 Bunion of unspecified foot: Secondary | ICD-10-CM

## 2013-01-01 DIAGNOSIS — L02619 Cutaneous abscess of unspecified foot: Secondary | ICD-10-CM

## 2013-01-01 NOTE — Progress Notes (Signed)
This chart was scribed for Norberto Sorenson, MD by Joaquin Music, ED Scribe. This patient was seen in room Room/bed 13 and the patient's care was started at 8:15 AM.  Subjective:    Patient ID: Jose Cook, male    DOB: 07-19-1955, 57 y.o.   MRN: [id number] Chief Complaint  Patient presents with  . Follow-up    Right foot infection re-check   HPI Jose Cook is a 57 y.o. male with a hx of Gout and HTN who presents to the [facility] for a f/u appointment. Pt was recently seen 2d prior and diagnosed w/ a R medial MTP over-lying cellulitis and bunion. His cbc and xray were wnml at that time w/o concern for osteomyelitis.  Pt was prescribed doxycycline and reccommended warm antibiotic soaks. Pt states he has been improving and states he has been soaking his R foot with witch hazel and warm water in a home massager and applying the topical mupriocin. Has been taking the doxycycline bid w/o trouble. He states his only problem is putting on a shoe as this causes great pain and applying a bandage to the area as he can't get it to stick. Pt is unsure if the area has been draining. Pt denies fever or chills.  Past Medical History  Diagnosis Date  . Gout   . HTN (hypertension)     Current Outpatient Prescriptions on File Prior to Visit  Medication Sig Dispense Refill  . allopurinol (ZYLOPRIM) 100 MG tablet Take 100 mg by mouth daily.      . colchicine (COLCRYS) 0.6 MG tablet Take 0.6 mg by mouth daily.      Marland Kitchen doxycycline (VIBRAMYCIN) 100 MG capsule Take 1 capsule (100 mg total) by mouth 2 (two) times daily.  20 capsule  0  . mupirocin ointment (BACTROBAN) 2 % Apply 1 application topically 3 (three) times daily.  22 g  1  . oxyCODONE-acetaminophen (ROXICET) 5-325 MG per tablet Take 1 tablet by mouth every 8 (eight) hours as needed for severe pain.  20 tablet  0   No current facility-administered medications on file prior to visit.   No Known Allergies  Review of Systems  Constitutional:  Negative for fever and chills.  Cardiovascular: Negative for leg swelling.  Musculoskeletal: Positive for arthralgias and gait problem.  Skin: Positive for color change and rash.   BP 150/94  Pulse 76  Temp(Src) 98 F (36.7 C) (Oral)  Resp 16  Ht 6' 4.5" (1.943 m)  Wt 231 lb (104.781 kg)  BMI 27.75 kg/m2  SpO2 99% Objective:   Physical Exam  Nursing note and vitals reviewed. Constitutional: He is oriented to person, place, and time. He appears well-developed and well-nourished. No distress.  HENT:  Head: Normocephalic and atraumatic.  Eyes: Pupils are equal, round, and reactive to light.  Neck: Normal range of motion.  Cardiovascular: Normal rate and regular rhythm.   Pulmonary/Chest: Effort normal. No respiratory distress.  Musculoskeletal: Normal range of motion.  medial aspect of Rt 1st MTP joint w/ moderate bunion. Overlying skin w/ blanching erythema with pinpoint central opening with surrounding dry scaly skin. No warmth, mild tenderness, no drainage. Erythema extends to 7 by 4 cm. Significantly decreased ROM in joint. 2+ dorsalis pedis and posterior tibial pulses on R. Normal cap refill.  Neurological: He is alert and oriented to person, place, and time.  Skin: Skin is warm and dry.  Psychiatric: He has a normal mood and affect. His behavior is normal.  Assessment & Plan:  Cellulitis of foot, right - cont doxy. Cont warm water soaks - d/c witchhazel as to dry, cont topical antibiotic. OK to RTW in 2 more days.  Bunion of great toe of right foot - Needs f/u w/ podiatry in 1-2d to discuss ways to relief pressure from bunion. Suspect it developed from abrasion to shoe and pt not ready for surgery so needs to consider padding or splint to avoid further irritation to area.  Pt prefers NOT to be seen at [facility] - stat referral placed over wkend - pt instructed to call if he does not hear of appt sched by the end of the day.   I personally performed the services described in  this documentation, which was scribed in my presence. The recorded information has been reviewed and considered, and addended by me as needed.  Norberto Sorenson, MD MPH

## 2013-01-02 LAB — WOUND CULTURE
Gram Stain: NONE SEEN
Gram Stain: NONE SEEN

## 2013-04-11 ENCOUNTER — Ambulatory Visit (INDEPENDENT_AMBULATORY_CARE_PROVIDER_SITE_OTHER): Payer: 59

## 2013-04-11 VITALS — BP 162/98 | HR 66 | Resp 12

## 2013-04-11 DIAGNOSIS — M779 Enthesopathy, unspecified: Secondary | ICD-10-CM

## 2013-04-11 DIAGNOSIS — M778 Other enthesopathies, not elsewhere classified: Secondary | ICD-10-CM

## 2013-04-11 DIAGNOSIS — M2010 Hallux valgus (acquired), unspecified foot: Secondary | ICD-10-CM

## 2013-04-11 DIAGNOSIS — R52 Pain, unspecified: Secondary | ICD-10-CM

## 2013-04-11 DIAGNOSIS — M7750 Other enthesopathy of unspecified foot: Secondary | ICD-10-CM

## 2013-04-11 DIAGNOSIS — M2040 Other hammer toe(s) (acquired), unspecified foot: Secondary | ICD-10-CM

## 2013-04-11 DIAGNOSIS — M1990 Unspecified osteoarthritis, unspecified site: Secondary | ICD-10-CM

## 2013-04-11 MED ORDER — MELOXICAM 15 MG PO TABS: 15.0000 mg | ORAL_TABLET | Freq: Every day | ORAL | Status: DC

## 2013-04-11 NOTE — Progress Notes (Signed)
   Subjective:    Patient ID: Jose Cook, male    DOB: 09/27/1955, 58 y.o.   MRN: [name]  HPI PT STATED RT FOOT BUNION IS BEEN HURTING FOR 5 MONTHS. THE FOOT IS GETTING WORSE. THE FOOT AGGRAVATED BY PUTTING PRESSURE ON IT. TOOK SOME ANTIBIOTIC, BUNION, AND SOAK AND HELP SOME.    Review of Systems  Eyes: Positive for itching.       Objective:   Physical Exam R objective findings as follows patient has no pulses palpable DP and PT +2/4 bilateral capillary refill time 3 seconds all digits skin temperature is warm turgor normal no edema rubor pallor or varicosities noted. There is an area of erythema and some history keratoses first MTP area right foot palliative breakdown recently that since healed shoe with a topical antibiotic ointment except for. Patient also history of arthritis affecting his ankles in the past is been treated by Dr. 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 At this time Refill time 3 seconds all digits epicritic and proprioceptive sensations are intact and symmetric bilateral. 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 Therapy and with the patient likely needs a 2-4 with to accommodate the Y. forefoot and bunion deformities. Regress also reveal arthritic changes of the TN joint and calcaneocuboid joint collapse the subtalar with valgus deformity as well as a hammertoe contractures HAV deformity. IM angles 1450 bilateral hallux abductus angle right foot is 30 with significant bunion  being noted.       Assessment & Plan:  Assessment HAV deformity with capsulitis first MTP area bilateral right more so than left is also hammertoe deformities without arthropathy and capsulitis. Plan at this time patient would benefit from surgical intervention areas are ready for surgery at this time for varus recent financial and work related at this time patient can some tube foam padding to cushion the toes pregnancy MOBIC 15 mg once daily anti-inflammatory to help with the pain is mild arthropathy did make recommendation prescriptions for wider shoes such as a new balance or Brooks or apex shoe which can be obtained at shoe market or similar facility. Prescription for wider shoes as a medical necessity is dispensed the patient this time followup with the next month or 2 for reevaluation and possible shoe adjustments if needed and possible discussion of surgical options at some point in the future next  Alvan Dameichard Curlie Sittner DPM

## 2013-04-11 NOTE — Patient Instructions (Signed)
Bunion Care A bunion is a boney protrusion at the base of your big toe (metatarsal-phalangeal joint). This problem, if painful or troublesome can be corrected with surgery. This is an elective surgery, so you can pick a convenient time for the procedure. The surgery may:  Improve appearance (cosmetic).  Relieve pain.  Improve function. Your foot is made up of a complex set of twenty-six bones which are held together by tough fibrous ligaments. The movement of the foot is controlled by muscles in the foot and leg. These muscles attach to the foot by cord like structures (tendons) that attach muscle to bone. If surgery is recommended, your caregiver will explain your foot problem and how surgery can improve it. Your caregiver can answer questions you may have about the potential risks and complications involved. After determining that foot surgery is necessary to correct your problem, you can proceed with plans for the surgery. LET YOUR CAREGIVER KNOW ABOUT:   Previous problems with anesthetics or medicines used to numb the skin.  Allergies to dyes, iodine, foods, and/or latex.  Medicines taken including herbs, eye drops, prescription medicines (especially medicines used to "thin the blood"), aspirin and other over-the-counter medicines, and steroids (by mouth or as a cream).  History of bleeding or blood problems.  Possibility of pregnancy, if this applies.  History of blood clots in your legs and/or lungs.  Previous surgery.  Other important health problems. Let your caregiver know about health changes prior to surgery. BEFORE THE PROCEDURE  You should be present 60 minutes prior to your procedure or as directed.  PROCEDURE BUNION TYPES AND THEIR TREATMENTS  Positional Bunion. A positional bunion develops when a bony growth on the side of the metatarsal bone enlarges the joint. The metatarsal forces the joint capsule to stretch over it. As this growth pushes the big toe toward the  others, the tendons on the inside tighten. This forces the big toe farther out of alignment. The bunion presses against the shoe, irritating the skin and causes further pain and disability.  Positional Bunionectomy Treatment. The bunion is removed. Tight tendons may be released.  Follow-up Care. Your toe is apt to be stiff at first but will loosen up as you move it. You may need to wear a special surgical shoe and, possibly, a splint for about three weeks.  Mild Structural Bunion. Structural bunions occur when the angle between the first and second metatarsal bones increases to a point where it is greater than normal. The increased angle of the metatarsals makes the big toe slant toward the other toes. Sometimes bony growths may form. Irritation and swelling often follow.  Structural Bunionectomy Treatment. Your caregiver surgically repositions the bone by decreasing the angle and may use a fixation device to hold it together. The bunion (bump) is also removed.  Degenerative Joint Disease (Arthritis). When wear-and-tear arthritis (osteoarthritis) of aging affects the big toe joint, pain and reduced joint motion may result. This is not a true bunion but may be associated with bunions. Left untreated, it can increase wear and tear in the joint and break down the cartilage. Pain and stiffness are problems of both wear-and-tear arthritis and rheumatoid arthritis.  Arthroplasty With Joint Implantation As Treatment. The bunion is first removed; then the degenerated joint is removed and replaced with an implant. AFTER THE PROCEDURE  After surgery your bone will heal in phases. A callous (new bone formation) forms, bridging the damaged bone allowing it to heal. In about 6 months, the bone   is back to normal strength along with a return of nearly normal function. It is best to do elective surgeries when your health is optimal.  After surgery, you will be taken to the recovery area where a nurse will watch and  check your progress. Once you are awake, stable, and taking fluids well, barring other problems you will be allowed to go home. HOME CARE INSTRUCTIONS  Be sure to ask your caregiver how long you will be off your feet and home from work. Plan accordingly. There are several types of bunions and varying surgical treatments for each. Common types are explained above. Your surgery may be similar and may include a fixation device (such as a small screw). Your foot and ankle may be immobilized by a cast (from your toes to below your knee). You may be asked not to bear weight on this foot for a few weeks or until comfortable. Once home, an ice pack applied to your operative site may help with discomfort and keep the swelling down. You may be able to walk a day or two after surgery. Your podiatrist may prescribe a splint or a special shoe to be worn for several weeks. Only take over-the-counter or prescription medicines for pain, discomfort, or fever as directed by your caregiver.  SEEK MEDICAL CARE IF:   There is increased bleeding (more than a small spot) from the surgical site.  You notice redness, swelling, or increasing pain in the surgical site.  Pus is coming from the site.  An unexplained oral temperature above 102 F (38.9 C) develops.  You notice a foul smell coming from the surgical site or dressing. SEEK IMMEDIATE MEDICAL CARE IF:  You develop a rash, have difficulty breathing, or have any allergic problems with medications. Document Released: 01/02/2000 Document Revised: 03/29/2011 Document Reviewed: 01/08/2008 ExitCare Patient Information 2014 [employer].  

## 2013-11-23 ENCOUNTER — Other Ambulatory Visit: Payer: Self-pay

## 2014-05-10 ENCOUNTER — Encounter: Payer: Self-pay | Admitting: Podiatry

## 2014-05-10 ENCOUNTER — Ambulatory Visit (INDEPENDENT_AMBULATORY_CARE_PROVIDER_SITE_OTHER): Payer: 59 | Admitting: Podiatry

## 2014-05-10 VITALS — BP 163/98 | HR 69 | Resp 18

## 2014-05-10 DIAGNOSIS — M2011 Hallux valgus (acquired), right foot: Secondary | ICD-10-CM

## 2014-05-10 DIAGNOSIS — M2041 Other hammer toe(s) (acquired), right foot: Secondary | ICD-10-CM | POA: Diagnosis not present

## 2014-05-10 MED ORDER — MELOXICAM 15 MG PO TABS: 15.0000 mg | ORAL_TABLET | Freq: Every day | ORAL | Status: DC

## 2014-05-10 NOTE — Patient Instructions (Signed)
Bunionectomy A bunionectomy is surgery to remove a bunion. A bunion is an enlargement of the joint at the base of the big toe. It is made up of bone and soft tissue on the inside part of the joint. Over time, a painful lump appears on the inside of the joint. The big toe begins to point inward toward the second toe. New bone growth can occur and a bone spur may form. The pain eventually causes difficulty walking. A bunion usually results from inflammation caused by the irritation of poorly fitting shoes. It often begins later in life. A bunionectomy is performed when nonsurgical treatment no longer works. When surgery is needed, the extent of the procedure will depend on the degree of deformity of the foot. Your surgeon will discuss with you the different procedures and what will work best for you depending on your age and health. LET YOUR CAREGIVER KNOW ABOUT:   Previous problems with anesthetics or medicines used to numb the skin.  Allergies to dyes, iodine, foods, and/or latex.  Medicines taken including herbs, eye drops, prescription medicines (especially medicines used to "thin the blood"), aspirin and other over-the-counter medicines, and steroids (by mouth or as a cream).  History of bleeding or blood problems.  Possibility of pregnancy, if this applies.  History of blood clots in your legs and/or lungs .  Previous surgery.  Other important health problems. RISKS AND COMPLICATIONS   Infection.  Pain.  Nerve damage.  Possibility that the bunion will recur. BEFORE THE PROCEDURE  You should be present 60 minutes prior to your procedure or as directed.  PROCEDURE  Surgery is often done so that you can go home the same day (outpatient). It may be done in a hospital or in an outpatient surgical center. An anesthetic will be used to help you sleep during the procedure. Sometimes, a spinal anesthetic is used to make you numb below the waist. A cut (incision) is made over the swollen  area at the first joint of the big toe. The enlarged lump will be removed. If there is a need to reposition the bones of the big toe, this may require more than 1 incision. The bone itself may need to be cut. Screws and wires may be used in the repair. These can be removed at a later date. In severe cases, the entire joint may need to be removed and a joint replacement inserted. When done, the incision is closed with stitches (sutures). Skin adhesive strips may be added for reinforcement. They help hold the incision closed.  AFTER THE PROCEDURE  Compression bandages (dressings) are then wrapped around the wound. This helps to keep the foot in alignment and reduce swelling. Your foot will be monitored for bleeding and swelling. You will need to stay for a few hours in the recovery area before being discharged. This allows time for the anesthesia to wear off. You will be discharged home when you are awake, stable, and doing well. HOME CARE INSTRUCTIONS   You can expect to return to normal activities within 6 to 8 weeks after surgery. The foot is at increased risk for swelling for several months. When you can expect to bear weight on the operated foot will depend on the extent of your surgery. The milder the deformity, the less tissue is removed and the sooner the return to normal activity level. During the recovery period, a special shoe, boot, or cast may be worn to accommodate the surgical bandage and to help provide stability   to the foot.  Once you are home, an ice pack applied to the operative site may help with discomfort and keep swelling down. Stop using the ice if it causes discomfort.  Keep your feet raised (elevated) when possible to lessen swelling.  If you have an elastic bandage on your foot and you have numbness, tingling, or your foot becomes cold and blue, adjust the bandage to make it comfortable.  Change dressings as directed.  Keep the wound dry and clean. The wound may be washed  gently with soap and water. Gently blot dry without rubbing. Do not take baths or use swimming pools or hot tubs for 10 days, or as instructed by your caregiver.  Only take over-the-counter or prescription medicines for pain, discomfort, or fever as directed by your caregiver.  You may continue a normal diet as directed.  For activity, use crutches with no weight bearing or your orthopedic shoe as directed. Continue to use crutches or a cane as directed until you can stand without causing pain. SEEK MEDICAL CARE IF:   You have redness, swelling, bruising, or increasing pain in the wound.  There is pus coming from the wound.  You have drainage from a wound lasting longer than 1 day.  You have an oral temperature above 102 F (38.9 C).  You notice a bad smell coming from the wound or dressing.  The wound breaks open after sutures have been removed.  You develop dizzy episodes or fainting while standing.  You have persistent nausea or vomiting.  Your toes become cold.  Pain is not relieved with medicines. SEEK IMMEDIATE MEDICAL CARE IF:   You develop a rash.  You have difficulty breathing.  You develop any reaction or side effects to medicines given.  Your toes are numb or blue, or you have severe pain. MAKE SURE YOU:   Understand these instructions.  Will watch your condition.  Will get help right away if you are not doing well or get worse. Document Released: 12/18/2004 Document Revised: 03/29/2011 Document Reviewed: 01/23/2007 ExitCare Patient Information 2015 [employer]. This information is not intended to replace advice given to you by your health care provider. Make sure you discuss any questions you have with your health care provider.  

## 2014-05-11 ENCOUNTER — Encounter: Payer: Self-pay | Admitting: Podiatry

## 2014-05-11 NOTE — Progress Notes (Signed)
Patient ID: Jose BoxerJimmy L Cook, male   DOB: 10/15/1955, 59 y.o.   MRN: [id number]  Subjective: 59 year-old male presents the office today with right foot painful bunion and hammertoe deformity. He states that previously he discuss this with Dr. Ralene CorkSikora. He's been continuous shoe gear modifications and anti-inflammatories. He takes meloxicam as needed for the pain however he has run out of the medication and the pharmacy was unable to refill the medication due to the timeframe of the prescription. He is asking for new prescriptions this time. He is also inquiring about possible surgical intervention. No other complaints at this time.  Objective: AAO 3, NAD DP/PT pulses palpable, CRT less than 3 seconds Protective sensation intact with Simms Weinstein monofilament, vibratory sensation intact, Achilles tendon reflex intact. There is moderate structural HAV deformity present on the right foot with semirigid hammertoe contractures of digits 2 through 5. There is mild tenderness palpation along the medial aspect of the first metatarsal head and a slight irritation from shoe gear. There is also evidence of irritation on the dorsal PIPJ to the lesser digits from shoe gear. There is no pain or crepitation with first MTPJ range of motion. There is no hypermobility. There is no overlying edema, erythema, increase in warmth. MMT 5/5, ROM WNL No open lesions or pre-ulcerative lesions. No pain with calf compression, swelling, warmth, erythema  Assessment: 59 year old male with symptomatic right HAV, hammertoe contractures  Plan: -Previous x-rays were discussed the patient. -Treatment options were discussed include alternatives, risks, complications. -Refill meloxicam. Discussed side effects the medication directed to stop if any are to occur call the office. -Discussed shoe gear medications possible orthotics, padding, offloading. -Discussed possible surgical intervention. At this time I discussed with him possible  also bunionectomy with screw fixation, hammertoe repair including PIPJ arthrodesis of digits 2 through 5 with K wire fixation and PIPJ arthroplasty with derotational skinplasty the fifth digit. He'll consider his options and likely proceed with surgery in the future. -Follow-up as needed. Call with questions or concerns.

## 2014-06-19 ENCOUNTER — Ambulatory Visit (INDEPENDENT_AMBULATORY_CARE_PROVIDER_SITE_OTHER): Payer: 59 | Admitting: Urgent Care

## 2014-06-19 VITALS — BP 158/104 | HR 72 | Temp 98.1°F | Resp 12 | Ht 76.0 in | Wt 238.0 lb

## 2014-06-19 DIAGNOSIS — I10 Essential (primary) hypertension: Secondary | ICD-10-CM | POA: Diagnosis not present

## 2014-06-19 DIAGNOSIS — M10072 Idiopathic gout, left ankle and foot: Secondary | ICD-10-CM | POA: Diagnosis not present

## 2014-06-19 DIAGNOSIS — M25572 Pain in left ankle and joints of left foot: Secondary | ICD-10-CM | POA: Diagnosis not present

## 2014-06-19 DIAGNOSIS — Z720 Tobacco use: Secondary | ICD-10-CM

## 2014-06-19 DIAGNOSIS — Z716 Tobacco abuse counseling: Secondary | ICD-10-CM

## 2014-06-19 DIAGNOSIS — M109 Gout, unspecified: Secondary | ICD-10-CM

## 2014-06-19 DIAGNOSIS — F17200 Nicotine dependence, unspecified, uncomplicated: Secondary | ICD-10-CM

## 2014-06-19 LAB — COMPREHENSIVE METABOLIC PANEL
ALT: 32 U/L (ref 0–53)
AST: 26 U/L (ref 0–37)
Albumin: 4.6 g/dL (ref 3.5–5.2)
Alkaline Phosphatase: 70 U/L (ref 39–117)
BILIRUBIN TOTAL: 0.4 mg/dL (ref 0.2–1.2)
BUN: 18 mg/dL (ref 6–23)
CHLORIDE: 103 meq/L (ref 96–112)
CO2: 27 mEq/L (ref 19–32)
Calcium: 9.4 mg/dL (ref 8.4–10.5)
Creat: 1.08 mg/dL (ref 0.50–1.35)
Glucose, Bld: 97 mg/dL (ref 70–99)
Potassium: 3.7 mEq/L (ref 3.5–5.3)
Sodium: 140 mEq/L (ref 135–145)
TOTAL PROTEIN: 7.2 g/dL (ref 6.0–8.3)

## 2014-06-19 LAB — LIPID PANEL
CHOL/HDL RATIO: 6.4 ratio
CHOLESTEROL: 180 mg/dL (ref 0–200)
HDL: 28 mg/dL — AB (ref >=40)
TRIGLYCERIDES: 458 mg/dL — AB (ref <150)

## 2014-06-19 LAB — URIC ACID: URIC ACID, SERUM: 8.1 mg/dL — AB (ref 4.0–7.8)

## 2014-06-19 MED ORDER — LISINOPRIL-HYDROCHLOROTHIAZIDE 10-12.5 MG PO TABS
1.0000 | ORAL_TABLET | Freq: Every day | ORAL | Status: DC
Start: 2014-06-19 — End: 2014-06-26

## 2014-06-19 MED ORDER — INDOMETHACIN 50 MG PO CAPS: 50.0000 mg | ORAL_CAPSULE | Freq: Three times a day (TID) | ORAL | Status: DC

## 2014-06-19 MED ORDER — KETOROLAC TROMETHAMINE 60 MG/2ML IM SOLN
60.0000 mg | Freq: Once | INTRAMUSCULAR | Status: AC
  Administered 2014-06-19: 60 mg via INTRAMUSCULAR

## 2014-06-19 MED ORDER — BUPROPION HCL ER (SR) 150 MG PO TB12: 150.0000 mg | ORAL_TABLET | Freq: Two times a day (BID) | ORAL | Status: DC

## 2014-06-19 NOTE — Progress Notes (Signed)
MRN: [id number] DOB: 05/17/1955  Subjective:   Jose Cook is a 59 y.o. male presenting for chief complaint of Gout and medication refills  Gout - Reports 9 year history of gout. Usually occurs at first MTP joint, currently he is experiencing pain over her left first MTP. This pain started last night and is associated with swelling, he has taken one meloxicam this morning which has not helped his pain. 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 Previously he has tried both colchicine and indomethacin. Colchicine caused significant GI effects including diarrhea and stomach upset. Currently denies any preventative medication for gout flare. Denies fever, erythema, trauma, bony deformity, discoloration. The meloxicam was prescribed by a podiatrist which she was referred to by our clinic.  HTN - reports a history of elevated blood pressure without diagnoses of hypertension. He has never started any blood pressure medication. Currently denies chest pain, heart racing, palpitations, diaphoresis, chronic headaches, double vision, blurred vision, nausea, vomiting, abdominal pain, hematuria, oliguria, anuria, lower leg swelling. Denies polydipsia, polyuria, numbness and tingling. Denies family history of heart disease, admits mother has hypertension. Denies family history of diabetes in first-degree relatives.  Smoking -  Report smoking one pack per day for the past 20 years. Admits that he is trying to quit 3 times but has never tried medicines for this. He was previously recommended Chantix but never followed up to start this medication. He admits today that he is willing to quit smoking and would be interested in starting medication for this.   Denies any other aggravating or relieving factors, no other questions or concerns.  Jose Cook currently takes meloxicam. He has No Known Allergies.  Jose Cook  has a past medical history of Gout and HTN  (hypertension). Also  has no past surgical history on file.  ROS As in subjective.  Objective:   Vitals: BP 158/104 mmHg  Pulse 72  Temp(Src) 98.1 F (36.7 C) (Oral)  Resp 12  Ht  (1.93 m)  Wt 238 lb (107.956 kg)  BMI 28.98 kg/m2  SpO2 98%  BP Readings from Last 3 Encounters:  06/19/14 158/104  05/10/14 163/98  04/11/13 162/98   Physical Exam  Constitutional: He is oriented to person, place, and time. He appears well-developed and well-nourished.  HENT:  Mouth/Throat: Oropharynx is clear and moist.  Eyes: No scleral icterus.  Neck: Normal range of motion. Neck supple. No thyromegaly present.  Cardiovascular: Normal rate, regular rhythm and intact distal pulses.  Exam reveals no gallop and no friction rub.   No murmur heard. Pulmonary/Chest: No respiratory distress. He has no wheezes. He has no rales.  Abdominal: Soft. Bowel sounds are normal. He exhibits no distension and no mass. There is no tenderness.  Musculoskeletal:       Left ankle: He exhibits swelling (as depicted). He exhibits normal range of motion, no ecchymosis, no deformity, no laceration and normal pulse. Tenderness (over 1st MTP, as depicted).       Feet:  Neurological: He is alert and oriented to person, place, and time.  Skin: Skin is warm and dry.   Assessment and Plan :   1. Uncontrolled stage 2 hypertension - Counseled patient on risks of uncontrolled hypertension, patient agreed to start blood pressure medication with hydrochlorothiazide lisinopril. Counseled on dietary modifications including practicing healthy diet, including 3 balanced meals with servings of vegetables, fruits, salads, lean meats, avoidance of salt. Adequate hydration, at least 64  ounces of water daily. Labs are pending, recheck in 4 weeks.   2. Acute gout of left foot, unspecified cause 3. Pain in joint, ankle and foot, left - Uric acid level pending, will start indomethacin 3 times a day for 10 days, provided refill -  Counseled on avoiding protein, drinks one to 2 beers over the weekend and told him that he may continue this.  4. Tobacco use disorder 5. Encounter for smoking cessation counseling - Has long-standing history of smoking, patient is agreeable to smoking cessation and starting Wellbutrin to help with this, counseling provided. Will start trial of Wellbutrin for a minimum of 3 months. Follow-up as above.  Jose BambergMario Homero Hyson, PA-C [facility] [phone] 06/19/2014 4:38 PM

## 2014-06-19 NOTE — Patient Instructions (Addendum)
Hypertension Hypertension, commonly called high blood pressure, is when the force of blood pumping through your arteries is too strong. Your arteries are the blood vessels that carry blood from your heart throughout your body. A blood pressure reading consists of a higher number over a lower number, such as 110/72. The higher number (systolic) is the pressure inside your arteries when your heart pumps. The lower number (diastolic) is the pressure inside your arteries when your heart relaxes. Ideally you want your blood pressure below 120/80. Hypertension forces your heart to work harder to pump blood. Your arteries may become narrow or stiff. Having hypertension puts you at risk for heart disease, stroke, and other problems.  RISK FACTORS Some risk factors for high blood pressure are controllable. Others are not.  Risk factors you cannot control include:   Race. You may be at higher risk if you are African American.  Age. Risk increases with age.  Gender. Men are at higher risk than women before age 45 years. After age 65, women are at higher risk than men. Risk factors you can control include:  Not getting enough exercise or physical activity.  Being overweight.  Getting too much fat, sugar, calories, or salt in your diet.  Drinking too much alcohol. SIGNS AND SYMPTOMS Hypertension does not usually cause signs or symptoms. Extremely high blood pressure (hypertensive crisis) may cause headache, anxiety, shortness of breath, and nosebleed. DIAGNOSIS  To check if you have hypertension, your health care provider will measure your blood pressure while you are seated, with your arm held at the level of your heart. It should be measured at least twice using the same arm. Certain conditions can cause a difference in blood pressure between your right and left arms. A blood pressure reading that is higher than normal on one occasion does not mean that you need treatment. If one blood pressure reading  is high, ask your health care provider about having it checked again. TREATMENT  Treating high blood pressure includes making lifestyle changes and possibly taking medicine. Living a healthy lifestyle can help lower high blood pressure. You may need to change some of your habits. Lifestyle changes may include:  Following the DASH diet. This diet is high in fruits, vegetables, and whole grains. It is low in salt, red meat, and added sugars.  Getting at least 2 hours of brisk physical activity every week.  Losing weight if necessary.  Not smoking.  Limiting alcoholic beverages.  Learning ways to reduce stress. If lifestyle changes are not enough to get your blood pressure under control, your health care provider may prescribe medicine. You may need to take more than one. Work closely with your health care provider to understand the risks and benefits. HOME CARE INSTRUCTIONS  Have your blood pressure rechecked as directed by your health care provider.   Take medicines only as directed by your health care provider. Follow the directions carefully. Blood pressure medicines must be taken as prescribed. The medicine does not work as well when you skip doses. Skipping doses also puts you at risk for problems.   Do not smoke.   Monitor your blood pressure at home as directed by your health care provider. SEEK MEDICAL CARE IF:   You think you are having a reaction to medicines taken.  You have recurrent headaches or feel dizzy.  You have swelling in your ankles.  You have trouble with your vision. SEEK IMMEDIATE MEDICAL CARE IF:  You develop a severe headache or confusion.    You have unusual weakness, numbness, or feel faint.  You have severe chest or abdominal pain.  You vomit repeatedly.  You have trouble breathing. MAKE SURE YOU:   Understand these instructions.  Will watch your condition.  Will get help right away if you are not doing well or get worse. Document  Released: 01/04/2005 Document Revised: 05/21/2013 Document Reviewed: 10/27/2012 [facility] Patient Information 2015 [place], [place]. This information is not intended to replace advice given to you by your health care provider. Make sure you discuss any questions you have with your health care provider.    DASH Eating Plan DASH stands for "Dietary Approaches to Stop Hypertension." The DASH eating plan is a healthy eating plan that has been shown to reduce high blood pressure (hypertension). Additional health benefits may include reducing the risk of type 2 diabetes mellitus, heart disease, and stroke. The DASH eating plan may also help with weight loss. WHAT DO I NEED TO KNOW ABOUT THE DASH EATING PLAN? For the DASH eating plan, you will follow these general guidelines:  Choose foods with a percent daily value for sodium of less than 5% (as listed on the food label).  Use salt-free seasonings or herbs instead of table salt or sea salt.  Check with your health care provider or pharmacist before using salt substitutes.  Eat lower-sodium products, often labeled as "lower sodium" or "no salt added."  Eat fresh foods.  Eat more vegetables, fruits, and low-fat dairy products.  Choose whole grains. Look for the word "whole" as the first word in the ingredient list.  Choose fish and skinless chicken or [place] more often than red meat. Limit fish, poultry, and meat to 6 oz (170 g) each day.  Limit sweets, desserts, sugars, and sugary drinks.  Choose heart-healthy fats.  Limit cheese to 1 oz (28 g) per day.  Eat more home-cooked food and less restaurant, buffet, and fast food.  Limit fried foods.  Cook foods using methods other than frying.  Limit canned vegetables. If you do use them, rinse them well to decrease the sodium.  When eating at a restaurant, ask that your food be prepared with less salt, or no salt if possible. WHAT FOODS CAN I EAT? Seek help from a dietitian for individual  calorie needs. Grains Whole grain or whole wheat bread. Brown rice. Whole grain or whole wheat pasta. Quinoa, bulgur, and whole grain cereals. Low-sodium cereals. Corn or whole wheat flour tortillas. Whole grain cornbread. Whole grain crackers. Low-sodium crackers. Vegetables Fresh or frozen vegetables (raw, steamed, roasted, or grilled). Low-sodium or reduced-sodium tomato and vegetable juices. Low-sodium or reduced-sodium tomato sauce and paste. Low-sodium or reduced-sodium canned vegetables.  Fruits All fresh, canned (in natural juice), or frozen fruits. Meat and Other Protein Products Ground beef (85% or leaner), grass-fed beef, or beef trimmed of fat. Skinless chicken or [place]. Ground chicken or [place]. Pork trimmed of fat. All fish and seafood. Eggs. Dried beans, peas, or lentils. Unsalted nuts and seeds. Unsalted canned beans. Dairy Low-fat dairy products, such as skim or 1% milk, 2% or reduced-fat cheeses, low-fat ricotta or cottage cheese, or plain low-fat yogurt. Low-sodium or reduced-sodium cheeses. Fats and Oils Tub margarines without trans fats. Light or reduced-fat mayonnaise and salad dressings (reduced sodium). Avocado. Safflower, olive, or canola oils. Natural peanut or almond butter. Other Unsalted popcorn and pretzels. The items listed above may not be a complete list of recommended foods or beverages. Contact your dietitian for more options. WHAT FOODS ARE NOT RECOMMENDED?  Grains White bread. White pasta. White rice. Refined cornbread. Bagels and croissants. Crackers that contain trans fat. Vegetables Creamed or fried vegetables. Vegetables in a cheese sauce. Regular canned vegetables. Regular canned tomato sauce and paste. Regular tomato and vegetable juices. Fruits Dried fruits. Canned fruit in light or heavy syrup. Fruit juice. Meat and Other Protein Products Fatty cuts of meat. Ribs, chicken wings, bacon, sausage, bologna, salami, chitterlings, fatback, hot dogs,  bratwurst, and packaged luncheon meats. Salted nuts and seeds. Canned beans with salt. Dairy Whole or 2% milk, cream, half-and-half, and cream cheese. Whole-fat or sweetened yogurt. Full-fat cheeses or blue cheese. Nondairy creamers and whipped toppings. Processed cheese, cheese spreads, or cheese curds. Condiments Onion and garlic salt, seasoned salt, table salt, and sea salt. Canned and packaged gravies. Worcestershire sauce. Tartar sauce. Barbecue sauce. Teriyaki sauce. Soy sauce, including reduced sodium. Steak sauce. Fish sauce. Oyster sauce. Cocktail sauce. Horseradish. Ketchup and mustard. Meat flavorings and tenderizers. Bouillon cubes. Hot sauce. Tabasco sauce. Marinades. Taco seasonings. Relishes. Fats and Oils Butter, stick margarine, lard, shortening, ghee, and bacon fat. Coconut, palm kernel, or palm oils. Regular salad dressings. Other Pickles and olives. Salted popcorn and pretzels. The items listed above may not be a complete list of foods and beverages to avoid. Contact your dietitian for more information. WHERE CAN I FIND MORE INFORMATION? National Heart, Lung, and Blood Institute: [employer] Document Released: 12/24/2010 Document Revised: 05/21/2013 Document Reviewed: 11/08/2012 [facility] Patient Information 2015 [place], [place]. This information is not intended to replace advice given to you by your health care provider. Make sure you discuss any questions you have with your health care provider.   Smoking Cessation Quitting smoking is important to your health and has many advantages. However, it is not always easy to quit since nicotine is a very addictive drug. Oftentimes, people try 3 times or more before being able to quit. This document explains the best ways for you to prepare to quit smoking. Quitting takes hard work and a lot of effort, but you can do it. ADVANTAGES OF QUITTING SMOKING  You will live longer, feel better, and live  better.  Your body will feel the impact of quitting smoking almost immediately.  Within 20 minutes, blood pressure decreases. Your pulse returns to its normal level.  After 8 hours, carbon monoxide levels in the blood return to normal. Your oxygen level increases.  After 24 hours, the chance of having a heart attack starts to decrease. Your breath, hair, and body stop smelling like smoke.  After 48 hours, damaged nerve endings begin to recover. Your sense of taste and smell improve.  After 72 hours, the body is virtually free of nicotine. Your bronchial tubes relax and breathing becomes easier.  After 2 to 12 weeks, lungs can hold more air. Exercise becomes easier and circulation improves.  The risk of having a heart attack, stroke, cancer, or lung disease is greatly reduced.  After 1 year, the risk of coronary heart disease is cut in half.  After 5 years, the risk of stroke falls to the same as a nonsmoker.  After 10 years, the risk of lung cancer is cut in half and the risk of other cancers decreases significantly.  After 15 years, the risk of coronary heart disease drops, usually to the level of a nonsmoker.  If you are pregnant, quitting smoking will improve your chances of having a healthy baby.  The people you live with, especially any children, will be healthier.  You will have  extra money to spend on things other than cigarettes. QUESTIONS TO THINK ABOUT BEFORE ATTEMPTING TO QUIT You may want to talk about your answers with your health care provider.  Why do you want to quit?  If you tried to quit in the past, what helped and what did not?  What will be the most difficult situations for you after you quit? How will you plan to handle them?  Who can help you through the tough times? Your family? Friends? A health care provider?  What pleasures do you get from smoking? What ways can you still get pleasure if you quit? Here are some questions to ask your health care  provider:  How can you help me to be successful at quitting?  What medicine do you think would be best for me and how should I take it?  What should I do if I need more help?  What is smoking withdrawal like? How can I get information on withdrawal? GET READY  Set a quit date.  Change your environment by getting rid of all cigarettes, ashtrays, matches, and lighters in your home, car, or work. Do not let people smoke in your home.  Review your past attempts to quit. Think about what worked and what did not. GET SUPPORT AND ENCOURAGEMENT You have a better chance of being successful if you have help. You can get support in many ways.  Tell your family, friends, and coworkers that you are going to quit and need their support. Ask them not to smoke around you.  Get individual, group, or telephone counseling and support. Programs are available at [employer] and health centers. Call your local health department for information about programs in your area.  Spiritual beliefs and practices may help some smokers quit.  Download a "quit meter" on your computer to keep track of quit statistics, such as how long you have gone without smoking, cigarettes not smoked, and money saved.  Get a self-help book about quitting smoking and staying off tobacco. LEARN NEW SKILLS AND BEHAVIORS  Distract yourself from urges to smoke. Talk to someone, go for a walk, or occupy your time with a task.  Change your normal routine. Take a different route to work. Drink tea instead of coffee. Eat breakfast in a different place.  Reduce your stress. Take a hot bath, exercise, or read a book.  Plan something enjoyable to do every day. Reward yourself for not smoking.  Explore interactive web-based programs that specialize in helping you quit. GET MEDICINE AND USE IT CORRECTLY Medicines can help you stop smoking and decrease the urge to smoke. Combining medicine with the above behavioral methods and support  can greatly increase your chances of successfully quitting smoking.  Nicotine replacement therapy helps deliver nicotine to your body without the negative effects and risks of smoking. Nicotine replacement therapy includes nicotine gum, lozenges, inhalers, nasal sprays, and skin patches. Some may be available over-the-counter and others require a prescription.  Antidepressant medicine helps people abstain from smoking, but how this works is unknown. This medicine is available by prescription.  Nicotinic receptor partial agonist medicine simulates the effect of nicotine in your brain. This medicine is available by prescription. Ask your health care provider for advice about which medicines to use and how to use them based on your health history. Your health care provider will tell you what side effects to look out for if you choose to be on a medicine or therapy. Carefully read the information on  the package. Do not use any other product containing nicotine while using a nicotine replacement product.  RELAPSE OR DIFFICULT SITUATIONS Most relapses occur within the first 3 months after quitting. Do not be discouraged if you start smoking again. Remember, most people try several times before finally quitting. You may have symptoms of withdrawal because your body is used to nicotine. You may crave cigarettes, be irritable, feel very hungry, cough often, get headaches, or have difficulty concentrating. The withdrawal symptoms are only temporary. They are strongest when you first quit, but they will go away within 10-14 days. To reduce the chances of relapse, try to:  Avoid drinking alcohol. Drinking lowers your chances of successfully quitting.  Reduce the amount of caffeine you consume. Once you quit smoking, the amount of caffeine in your body increases and can give you symptoms, such as a rapid heartbeat, sweating, and anxiety.  Avoid smokers because they can make you want to smoke.  Do not let weight  gain distract you. Many smokers will gain weight when they quit, usually less than 10 pounds. Eat a healthy diet and stay active. You can always lose the weight gained after you quit.  Find ways to improve your mood other than smoking. FOR MORE INFORMATION  www.smokefree.gov  Document Released: 12/29/2000 Document Revised: 05/21/2013 Document Reviewed: 04/15/2011 [facility] Patient Information 2015 [place], [place]. This information is not intended to replace advice given to you by your health care provider. Make sure you discuss any questions you have with your health care provider.    Bupropion sustained-release tablets (Depression/Mood Disorders) What is this medicine? BUPROPION (byoo PROE pee on) is used to treat depression. This medicine may be used for other purposes; ask your health care provider or pharmacist if you have questions. COMMON BRAND NAME(S): Budeprion SR, Wellbutrin SR What should I tell my health care provider before I take this medicine? They need to know if you have any of these conditions: -an eating disorder, such as anorexia or bulimia -bipolar disorder or psychosis -diabetes or high blood sugar, treated with medication -glaucoma -head injury or brain tumor -heart disease, previous heart attack, or irregular heart beat -high blood pressure -kidney or liver disease -seizures -suicidal thoughts or a previous suicide attempt -Tourette's syndrome -weight loss -an unusual or allergic reaction to bupropion, other medicines, foods, dyes, or preservatives -breast-feeding -pregnant or trying to become pregnant How should I use this medicine? Take this medicine by mouth with a glass of water. Follow the directions on the prescription label. You can take it with or without food. If it upsets your stomach, take it with food. Do not cut, crush or chew this medicine. Take your medicine at regular intervals. If you take this medicine more than once a day, take your second  dose at least 8 hours after you take your first dose. To limit difficulty in sleeping, avoid taking this medicine at bedtime. Do not take your medicine more often than directed. Do not stop taking this medicine suddenly except upon the advice of your doctor. Stopping this medicine too quickly may cause serious side effects or your condition may worsen. A special MedGuide will be given to you by the pharmacist with each prescription and refill. Be sure to read this information carefully each time. Talk to your pediatrician regarding the use of this medicine in children. Special care may be needed. Overdosage: If you think you have taken too much of this medicine contact a poison control center or emergency room at once.  NOTE: This medicine is only for you. Do not share this medicine with others. What if I miss a dose? If you miss a dose, skip the missed dose and take your next tablet at the regular time. There should be at least 8 hours between doses. Do not take double or extra doses. What may interact with this medicine? Do not take this medicine with any of the following medications: -linezolid -MAOIs like Azilect, Carbex, Eldepryl, Marplan, Nardil, and Parnate -methylene blue (injected into a vein) -other medicines that contain bupropion like Zyban This medicine may also interact with the following medications: -alcohol -certain medicines for anxiety or sleep -certain medicines for blood pressure like metoprolol, propranolol -certain medicines for depression or psychotic disturbances -certain medicines for HIV or AIDS like efavirenz, lopinavir, nelfinavir, ritonavir -certain medicines for irregular heart beat like propafenone, flecainide -certain medicines for Parkinson's disease like amantadine, levodopa -certain medicines for seizures like carbamazepine, phenytoin,  phenobarbital -cimetidine -clopidogrel -cyclophosphamide -furazolidone -isoniazid -nicotine -orphenadrine -procarbazine -steroid medicines like prednisone or cortisone -stimulant medicines for attention disorders, weight loss, or to stay awake -tamoxifen -theophylline -thiotepa -ticlopidine -tramadol -warfarin This list may not describe all possible interactions. Give your health care provider a list of all the medicines, herbs, non-prescription drugs, or dietary supplements you use. Also tell them if you smoke, drink alcohol, or use illegal drugs. Some items may interact with your medicine. What should I watch for while using this medicine? Tell your doctor if your symptoms do not get better or if they get worse. Visit your doctor or health care professional for regular checks on your progress. Because it may take several weeks to see the full effects of this medicine, it is important to continue your treatment as prescribed by your doctor. Patients and their families should watch out for new or worsening thoughts of suicide or depression. Also watch out for sudden changes in feelings such as feeling anxious, agitated, panicky, irritable, hostile, aggressive, impulsive, severely restless, overly excited and hyperactive, or not being able to sleep. If this happens, especially at the beginning of treatment or after a change in dose, call your health care professional. Avoid alcoholic drinks while taking this medicine. Drinking excessive alcoholic beverages, using sleeping or anxiety medicines, or quickly stopping the use of these agents while taking this medicine may increase your risk for a seizure. Do not drive or use heavy machinery until you know how this medicine affects you. This medicine can impair your ability to perform these tasks. Do not take this medicine close to bedtime. It may prevent you from sleeping. Your mouth may get dry. Chewing sugarless gum or sucking hard candy, and  drinking plenty of water may help. Contact your doctor if the problem does not go away or is severe. What side effects may I notice from receiving this medicine? Side effects that you should report to your doctor or health care professional as soon as possible: -allergic reactions like skin rash, itching or hives, swelling of the face, lips, or tongue -breathing problems -changes in vision -confusion -fast or irregular heartbeat -hallucinations -increased blood pressure -redness, blistering, peeling or loosening of the skin, including inside the mouth -seizures -suicidal thoughts or other mood changes -unusually weak or tired -vomiting Side effects that usually do not require medical attention (report to your doctor or health care professional if they continue or are bothersome): -change in sex drive or performance -constipation -headache -loss of appetite -nausea -tremors -weight loss This list may not describe all possible  side effects. Call your doctor for medical advice about side effects. You may report side effects to FDA at 1-800-FDA-1088. Where should I keep my medicine? Keep out of the reach of children. Store at room temperature between 20 and 25 degrees C (68 and 77 degrees F), away from direct sunlight and moisture. Keep tightly closed. Throw away any unused medicine after the expiration date. NOTE: This sheet is a summary. It may not cover all possible information. If you have questions about this medicine, talk to your doctor, pharmacist, or health care provider.  2015, [employer]vier/[employer] Standard. (2012-07-28 12:41:10)

## 2014-06-24 ENCOUNTER — Telehealth: Payer: Self-pay

## 2014-06-24 NOTE — Telephone Encounter (Signed)
Pt calling about labs. Please review. Thanks  

## 2014-06-24 NOTE — Telephone Encounter (Signed)
I told this patient I would review labs with him when I returned from out of town Wednesday. He does not have any urgent labs. If he wants review now, please send this to provider pool. Thank you!

## 2014-06-25 NOTE — Telephone Encounter (Signed)
Can someone please review. Thanks

## 2014-06-25 NOTE — Telephone Encounter (Signed)
Labs showed: Normal kidney and liver function. Mildly elevated uric acid, similar from checked 1 year ago. This is from his recent gout flare. Extremely elevated triglycerides and low HDL (good cholesterol). Bad cholesterol not able to be calculated from his high triglycerides but total cholesterol normal. Please ask pt if he was fasting. If not, it may be a good idea for him to return for a fasting cholesterol panel. If he was fasting, he likely needs to be on medication for his triglycerides. Diet changes are important - limit fatty foods (fried food, cheese, sweets, etc). Exercise can help to raise HDL. Please forward further questions/concerns to Gurney MaxinMike Mani, PA-C.

## 2014-06-26 ENCOUNTER — Ambulatory Visit (INDEPENDENT_AMBULATORY_CARE_PROVIDER_SITE_OTHER): Payer: 59 | Admitting: Family Medicine

## 2014-06-26 ENCOUNTER — Emergency Department (HOSPITAL_COMMUNITY)
Admission: EM | Admit: 2014-06-26 | Discharge: 2014-06-26 | Disposition: A | Discharge status: Home / Self Care | Payer: 59 | Attending: Emergency Medicine | Admitting: Emergency Medicine

## 2014-06-26 ENCOUNTER — Other Ambulatory Visit: Payer: Self-pay | Admitting: Urgent Care

## 2014-06-26 ENCOUNTER — Encounter (HOSPITAL_COMMUNITY): Payer: Self-pay | Admitting: *Deleted

## 2014-06-26 VITALS — BP 148/98 | HR 78 | Temp 98.6°F | Resp 17 | Ht 75.5 in | Wt 238.0 lb

## 2014-06-26 DIAGNOSIS — I10 Essential (primary) hypertension: Secondary | ICD-10-CM

## 2014-06-26 DIAGNOSIS — R42 Dizziness and giddiness: Secondary | ICD-10-CM

## 2014-06-26 DIAGNOSIS — M109 Gout, unspecified: Secondary | ICD-10-CM | POA: Diagnosis not present

## 2014-06-26 DIAGNOSIS — Z720 Tobacco use: Secondary | ICD-10-CM | POA: Insufficient documentation

## 2014-06-26 DIAGNOSIS — R9431 Abnormal electrocardiogram [ECG] [EKG]: Secondary | ICD-10-CM

## 2014-06-26 DIAGNOSIS — I160 Hypertensive urgency: Secondary | ICD-10-CM

## 2014-06-26 DIAGNOSIS — R011 Cardiac murmur, unspecified: Secondary | ICD-10-CM | POA: Diagnosis not present

## 2014-06-26 DIAGNOSIS — Z79899 Other long term (current) drug therapy: Secondary | ICD-10-CM | POA: Diagnosis not present

## 2014-06-26 LAB — I-STAT TROPONIN, ED: TROPONIN I, POC: 0.01 ng/mL (ref 0.00–0.08)

## 2014-06-26 LAB — I-STAT CHEM 8, ED
BUN: 20 mg/dL (ref 6–20)
Calcium, Ion: 1.19 mmol/L (ref 1.12–1.23)
Chloride: 103 mmol/L (ref 101–111)
Creatinine, Ser: 1.3 mg/dL — ABNORMAL HIGH (ref 0.61–1.24)
Glucose, Bld: 99 mg/dL (ref 65–99)
HEMATOCRIT: 44 % (ref 39.0–52.0)
Hemoglobin: 15 g/dL (ref 13.0–17.0)
POTASSIUM: 3.2 mmol/L — AB (ref 3.5–5.1)
Sodium: 142 mmol/L (ref 135–145)
TCO2: 24 mmol/L (ref 0–100)

## 2014-06-26 MED ORDER — POTASSIUM CHLORIDE CRYS ER 20 MEQ PO TBCR
40.0000 meq | EXTENDED_RELEASE_TABLET | Freq: Once | ORAL | Status: AC
  Administered 2014-06-26: 40 meq via ORAL
  Filled 2014-06-26: qty 2

## 2014-06-26 MED ORDER — LISINOPRIL-HYDROCHLOROTHIAZIDE 10-12.5 MG PO TABS: 2.0000 | ORAL_TABLET | Freq: Every day | ORAL | Status: DC

## 2014-06-26 MED ORDER — ALPRAZOLAM 0.5 MG PO TABS
0.5000 mg | ORAL_TABLET | Freq: Once | ORAL | Status: AC
  Administered 2014-06-26: 0.5 mg via ORAL
  Filled 2014-06-26: qty 1

## 2014-06-26 NOTE — Discharge Instructions (Signed)
-   Take an extra dose of Lisinopril-HCTZ 10-12.5 mg tonight - Starting tomorrow take double the dose (2 pills) - See your primary care doctor tomorrow to recheck your blood pressure and adjust medications as necessary

## 2014-06-26 NOTE — ED Provider Notes (Signed)
CSN: [id number]     Arrival date & time 06/26/14  1703 History   [id number] MD Initiated Contact with Patient 06/26/14 1710     Chief Complaint  Patient presents with  . Hypertension  . Abnormal ECG   HPI Jose Cook is a 59yo man with hx of recently diagnosed HTN who presents to the ED from an urgent care office for elevated blood pressure and EKG changes noted in the office. Patient states he was diagnosed about 1 week ago with HTN and started on Lisinopril-HCTZ 10-12.5 mg daily. He reports he has been checking his BP at home/work and today his systolic BP was 230. He then went to urgent care and there his SBP was 145 but then jumped to 220 and noted to have "possible" ST elevations and T wave inversions on EKG. He denies chest pain, dyspnea, nausea, vomiting, diaphoresis, changes in vision, weakness, and changes in speech. He reports he took his Lisinopril-HCTZ today.   Past Medical History  Diagnosis Date  . Gout   . HTN (hypertension)    History reviewed. No pertinent past surgical history. Family History  Problem Relation Age of Onset  . Cancer Father    History  Substance Use Topics  . Smoking status: Current Every Day Smoker -- 20 years  . Smokeless tobacco: Not on file  . Alcohol Use: Yes    Review of Systems General: Denies fever, chills, night sweats, changes in weight, changes in appetite HEENT: Denies headaches, ear pain, rhinorrhea, sore throat CV: Denies palpitations, orthopnea Pulm: Denies cough, wheezing GI: Denies abdominal pain, diarrhea, constipation, melena, hematochezia GU: Denies dysuria, hematuria, frequency Msk: Denies muscle cramps, joint pains Neuro: See above  Skin: Denies rashes, bruising   Allergies  Review of patient's allergies indicates no known allergies.  Home Medications   Prior to Admission medications   Medication Sig Start Date End Date Taking? Authorizing Provider  buPROPion (WELLBUTRIN SR) 150 MG 12 hr tablet Take 1 tablet (150 mg  total) by mouth 2 (two) times daily. 06/19/14   Wallis BambergMario Mani, PA-C  indomethacin (INDOCIN) 50 MG capsule Take 1 capsule (50 mg total) by mouth 3 (three) times daily with meals. Patient not taking: Reported on 06/26/2014 06/19/14   Wallis BambergMario Mani, PA-C  lisinopril-hydrochlorothiazide (PRINZIDE,ZESTORETIC) 10-12.5 MG per tablet Take 1 tablet by mouth daily. 06/19/14   Wallis BambergMario Mani, PA-C   BP 206/105 mmHg  Pulse 73  Temp(Src) 98.9 F (37.2 C) (Oral)  Resp 16  SpO2 99% Physical Exam General: middle aged man sitting up in bed, appears anxious  HEENT: [name], EOMI, PERRL. No retinal hemorrhages seen on ophtho exam. Mucus membranes moist CV: RRR, no m/g/r Pulm: CTA bilaterally, breaths non-labored Abd: BS+, soft, non-distended, non-tender Ext: warm, no edema Neuro: alert and oriented x 3. EOMI, PERRL. Smile symmetric. Finger to nose normal. Strength intact and symmetric in upper and lower extremities. Sensation normal.   ED Course  Procedures (including critical care time) Labs Review Labs Reviewed  I-STAT CHEM 8, ED - Abnormal; Notable for the following:    Potassium 3.2 (*)    Creatinine, Ser 1.30 (*)    All other components within normal limits  I-STAT TROPOININ, ED    Imaging Review No results found.   EKG Interpretation   Date/Time:  Wednesday June 26 2014 17:12:30 EDT Ventricular Rate:  69 PR Interval:    QRS Duration: 99 QT Interval:  416 QTC Calculation: 446 R Axis:   -25 Text Interpretation:  Sinus rhythm Borderline  left axis deviation RSR' in  V1 or V2, probably normal variant Borderline ST elevation, anterolateral  leads Abnormal ekg No previous tracing Confirmed by BEATON  MD, ROBERT  ([id number]) on 06/26/2014 5:21:54 PM      MDM   Final diagnoses:  Asymptomatic hypertensive urgency    59yo man with recently diagnosed hypertension presenting with hypertensive urgency. The office EKG does have T wave inversions in the lateral leads (V4-V6), no prior to compare. Repeat EKG with  sinus rhythm, left axis deviation, and T wave inversion only in V6. Will check bmet, troponin, and BP Q15 minutes. Xanax 0.5 mg once for anxiety.   BP has improved to 160s-170s systolic. Troponin negative. Cr slightly bumped to 1.3 but expect this with recently starting lisinopril. K slightly low at 3.2, this was repleted. Recommended for patient to take extra dose of Lisinopril-HCTZ tonight and then take double the dose starting tomorrow morning (20-25 mg). Patient was recommended to follow up with his PCP tomorrow morning to reassess his BP and medication change. Discharged home with instructions.   Su Hoff, MD 06/27/14 2103  Nelva Nay, MD 07/06/14 [id number]

## 2014-06-26 NOTE — Telephone Encounter (Signed)
Gave pt results. He was not fasting at time of visit. Advised him he needs to come back when he is fasting to get his blood drawn again. Pt agreed. Pt is actually in office right now getting seen again.

## 2014-06-26 NOTE — Progress Notes (Signed)
    MRN: [id number] DOB: 12/22/1955  Subjective:   Jose Cook is a 59 y.o. male presenting for follow up on HTN. Patient works as a Public relations account executivecorrectional officer, was feeling lightheaded and dizzy, his BP was taken while at work, was 200's/100's. His work sent him here to be checked up. The patient has started checking his BP at home and has been running 140's-160's systolic. He started his medication last week lis-HCT 10-12.5mg . He denies chest pain, chest tightness, heart racing, palpitations, shob, n/v, abdominal pain, limb pain, neck pain, jaw pain, diaphoresis, double vision, headache. Of note patient did start Wellbutrin for smoking cessation in the last couple of days. He has a long-standing history of smoking, ~1ppd for ~20 years. Denies any other aggravating or relieving factors, no other questions or concerns.  Jose Cook has a current medication list which includes the following prescription(s): bupropion, lisinopril-hydrochlorothiazide, and indomethacin. He has No Known Allergies.  Jose Cook  has a past medical history of Gout and HTN (hypertension). Also  has no past surgical history on file.  ROS As in subjective.  Objective:   Vitals: BP 148/98 mmHg  Pulse 78  Temp(Src) 98.6 F (37 C) (Oral)  Resp 17  Ht 6' 3.5" (1.918 m)  Wt 238 lb (107.956 kg)  BMI 29.35 kg/m2  SpO2 97%  BP Readings from Last 3 Encounters:  06/26/14 148/98  06/19/14 158/104  05/10/14 163/98   Physical Exam  Constitutional: He is oriented to person, place, and time. He appears well-developed and well-nourished.  HENT:  Mouth/Throat: Oropharynx is clear and moist.  Eyes: Conjunctivae are normal. Pupils are equal, round, and reactive to light. Right eye exhibits no discharge. Left eye exhibits no discharge. No scleral icterus.  Neck: Normal range of motion. Neck supple. No thyromegaly present.  Cardiovascular: Normal rate, regular rhythm and intact distal pulses.  Exam reveals no gallop and no friction rub.     Murmur (Low grade II/VI systolic murmur) heard. Pulmonary/Chest: No respiratory distress. He has no wheezes. He has no rales.  Neurological: He is alert and oriented to person, place, and time.  Skin: Skin is warm and dry. No rash noted. No erythema. No pallor.   ECG interpretation by Dr. Neva SeatGreene: possible ST elevations noted in Leads V2-V4, t-wave inversion in leads V5, V6.  Assessment and Plan :   1. Uncontrolled stage 2 hypertension 2. Nonspecific abnormal electrocardiogram (ECG) (EKG) 3. Dizziness 4. Heart murmur, systolic - Largely asymptomatic except for intermittent dizziness, [facility] for further emergent cardiac work up including serial troponin, possible ECHO for murmur. - Advised patient to continue blood pressure medication, hold off on Wellbutrin for now. - Follow up in 1 week, consider referral to cardiology.  Wallis BambergMario Jamaul Heist, PA-C [facility] [phone] 06/26/2014 3:44 PM

## 2014-06-26 NOTE — Telephone Encounter (Signed)
Actually, couldn't leave message. Mailbox full.

## 2014-06-26 NOTE — ED Notes (Signed)
Pt presents from MD office via [facility] for hypertension and EKG changes.  Pt recently dx with htn and started on lisinopril.  BP at work taken by [employer] was 230 systolic, WNL at MD office but EKG with t wave inversion and possible ST elevation.  Pt denies CP/N/V, HA or blurred vision.  Pt reports minor dizziness with standing.  BP-226/124 P-82 NSR, O2-96% RA, R-16, EKG with LVH.  Pt a x 4, NAD.  Neuro intact.

## 2014-06-26 NOTE — ED Notes (Signed)
Pt has discharge vitals and IV has been removed. Scrub top has been given due to pt not having a shirt to wear home. Pt now waiting for discharge papers. RN notified

## 2014-06-26 NOTE — Progress Notes (Signed)
EKG read and patient discussed with [name]. Agree with assessment and plan of care per his note. Concerning findings in anterior leads - with TWI V5-6, without prior EKG to review. No chest pain currently, but dizzy/lightheaded.  Sent by EMS to ER for further eval.

## 2014-06-26 NOTE — Telephone Encounter (Signed)
lmom to cb. 

## 2014-06-27 ENCOUNTER — Ambulatory Visit: Payer: 59

## 2014-07-02 ENCOUNTER — Telehealth: Payer: Self-pay | Admitting: Urgent Care

## 2014-07-02 NOTE — Telephone Encounter (Signed)
Patient did not have room available in VM. Please try patient once more on 07/03/2014 and then close the encounter if he still fails to get in touch with [place].

## 2014-07-02 NOTE — Telephone Encounter (Signed)
Called patient to see how he is doing regarding his blood pressure since we sent him to the ED for possible cardiac event and hypertensive urgency on 06/26/2014. Please call patient and request a reliable time for me to reach him.

## 2014-07-03 NOTE — Telephone Encounter (Signed)
I will send a letter out and close this encounter.  Thank you!

## 2014-07-03 NOTE — Telephone Encounter (Signed)
Called pt again.  No answer, no voicemail.

## 2014-07-04 ENCOUNTER — Encounter: Payer: Self-pay | Admitting: Gastroenterology

## 2014-07-08 ENCOUNTER — Telehealth: Payer: Self-pay

## 2014-07-08 MED ORDER — LISINOPRIL-HYDROCHLOROTHIAZIDE 20-25 MG PO TABS: 1.0000 | ORAL_TABLET | Freq: Every day | ORAL | Status: DC

## 2014-07-08 NOTE — Telephone Encounter (Signed)
Pt is having issues refilling his Lisinopril with [facility]. The last rx we gave him was for 30 pills, 2 pills per day. Naturally, he has already run out because he has been following these instructions. The pharmacy will not fill this for him until the 24th, but he is supposed to be taking this daily. Can we contact his pharmacy to correct this?

## 2014-07-08 NOTE — Telephone Encounter (Signed)
Patient needs to come back for follow up. He has uncontrolled stage 2 HTN and the plan was to increase his BP dose to help him control his HTN but this decision cannot be made without adequate clinical data. I will send in script for the right dose (30 day supply) but patient needs to come back for follow up.

## 2014-07-08 NOTE — Telephone Encounter (Signed)
Jose Cook please advise. It looks like the directions are take two tabs daily.

## 2014-07-09 NOTE — Telephone Encounter (Signed)
Called pt, no voicemail to leave message.

## 2014-07-11 NOTE — Telephone Encounter (Signed)
Left message for pt to call back  °

## 2014-07-12 NOTE — Telephone Encounter (Signed)
Left a detailed message to advise pt to come in.

## 2014-08-04 ENCOUNTER — Other Ambulatory Visit: Payer: Self-pay | Admitting: Podiatry

## 2014-09-29 ENCOUNTER — Other Ambulatory Visit: Payer: Self-pay | Admitting: Urgent Care

## 2014-10-31 ENCOUNTER — Other Ambulatory Visit: Payer: Self-pay | Admitting: Physician Assistant

## 2014-12-15 ENCOUNTER — Other Ambulatory Visit: Payer: Self-pay | Admitting: Urgent Care

## 2015-03-04 ENCOUNTER — Encounter: Payer: Self-pay | Admitting: Family Medicine

## 2016-02-10 DIAGNOSIS — E785 Hyperlipidemia, unspecified: Secondary | ICD-10-CM | POA: Diagnosis not present

## 2016-02-10 DIAGNOSIS — R739 Hyperglycemia, unspecified: Secondary | ICD-10-CM | POA: Diagnosis not present

## 2016-02-10 DIAGNOSIS — I10 Essential (primary) hypertension: Secondary | ICD-10-CM | POA: Diagnosis not present

## 2016-02-10 DIAGNOSIS — M109 Gout, unspecified: Secondary | ICD-10-CM | POA: Diagnosis not present

## 2016-03-02 DIAGNOSIS — R509 Fever, unspecified: Secondary | ICD-10-CM | POA: Diagnosis not present

## 2016-03-22 DIAGNOSIS — I10 Essential (primary) hypertension: Secondary | ICD-10-CM | POA: Diagnosis not present

## 2016-03-22 DIAGNOSIS — M109 Gout, unspecified: Secondary | ICD-10-CM | POA: Diagnosis not present

## 2016-10-25 DIAGNOSIS — Z23 Encounter for immunization: Secondary | ICD-10-CM | POA: Diagnosis not present

## 2017-09-04 DIAGNOSIS — S61210A Laceration without foreign body of right index finger without damage to nail, initial encounter: Secondary | ICD-10-CM | POA: Diagnosis not present

## 2017-09-04 DIAGNOSIS — Z23 Encounter for immunization: Secondary | ICD-10-CM | POA: Diagnosis not present

## 2017-09-04 DIAGNOSIS — L03011 Cellulitis of right finger: Secondary | ICD-10-CM | POA: Diagnosis not present

## 2017-09-15 DIAGNOSIS — M799 Soft tissue disorder, unspecified: Secondary | ICD-10-CM | POA: Diagnosis not present

## 2017-09-15 DIAGNOSIS — M7989 Other specified soft tissue disorders: Secondary | ICD-10-CM | POA: Diagnosis not present

## 2017-09-15 DIAGNOSIS — M79644 Pain in right finger(s): Secondary | ICD-10-CM | POA: Diagnosis not present

## 2017-09-15 DIAGNOSIS — L03011 Cellulitis of right finger: Secondary | ICD-10-CM | POA: Diagnosis not present

## 2017-10-01 DIAGNOSIS — M109 Gout, unspecified: Secondary | ICD-10-CM | POA: Diagnosis not present

## 2017-10-01 DIAGNOSIS — Z23 Encounter for immunization: Secondary | ICD-10-CM | POA: Diagnosis not present

## 2017-10-15 DIAGNOSIS — H2513 Age-related nuclear cataract, bilateral: Secondary | ICD-10-CM | POA: Diagnosis not present

## 2017-10-15 DIAGNOSIS — H40033 Anatomical narrow angle, bilateral: Secondary | ICD-10-CM | POA: Diagnosis not present

## 2018-02-18 DIAGNOSIS — H2513 Age-related nuclear cataract, bilateral: Secondary | ICD-10-CM | POA: Diagnosis not present

## 2018-04-07 DIAGNOSIS — M109 Gout, unspecified: Secondary | ICD-10-CM | POA: Diagnosis not present

## 2018-04-20 ENCOUNTER — Other Ambulatory Visit: Payer: Self-pay

## 2018-04-20 ENCOUNTER — Encounter: Payer: Self-pay | Admitting: Podiatry

## 2018-04-20 ENCOUNTER — Ambulatory Visit (INDEPENDENT_AMBULATORY_CARE_PROVIDER_SITE_OTHER): Payer: 59

## 2018-04-20 ENCOUNTER — Ambulatory Visit: Payer: 59 | Admitting: Podiatry

## 2018-04-20 VITALS — Temp 98.2°F

## 2018-04-20 DIAGNOSIS — M21619 Bunion of unspecified foot: Secondary | ICD-10-CM | POA: Diagnosis not present

## 2018-04-20 DIAGNOSIS — M109 Gout, unspecified: Secondary | ICD-10-CM | POA: Diagnosis not present

## 2018-04-20 DIAGNOSIS — M2042 Other hammer toe(s) (acquired), left foot: Secondary | ICD-10-CM | POA: Diagnosis not present

## 2018-04-20 DIAGNOSIS — M2041 Other hammer toe(s) (acquired), right foot: Secondary | ICD-10-CM

## 2018-04-20 MED ORDER — DICLOFENAC SODIUM 1 % TD GEL: 2.0000 g | Freq: Four times a day (QID) | TRANSDERMAL | 2 refills | Status: DC

## 2018-04-25 NOTE — Progress Notes (Signed)
Subjective: 63 year old male presents the office today for concerns of chronic gout to his right foot.  He states that he had a flare he was put on prednisone this is been very helpful but he really wants to discuss possible surgical intervention for the bunion on the right foot.  He states that he has had limited his activity and decreased denies working because of the discomfort.  He states it hurts in shoes on a regular basis.  He wants to consider surgery in the future and just wants to discuss this today. Denies any systemic complaints such as fevers, chills, nausea, vomiting. No acute changes since last appointment, and no other complaints at this time.   Objective: AAO x3, NAD DP/PT pulses palpable bilaterally, CRT less than 3 seconds Moderate bunion deformity present the right foot.  Mild decrease in range of motion first MPJ.  No crepitation.  There is edema to the right foot 1st MTPJ there is no significant erythema or increase in warmth.  There is no fluctuation crepitation.  No open sores.  Hammertoe deformities are present. No open lesions or pre-ulcerative lesions.  No pain with calf compression, swelling, warmth, erythema  Assessment: Right foot bunion deformity, chronic gout  Plan: -All treatment options discussed with the patient including all alternatives, risks, complications.  -X-rays obtained reviewed.  Moderate bunion deformities present.  Mild decrease in first MPJ joint space.  No evidence of acute fracture. -From a calcium point he seems to be doing better he said no pain on exam today and the swelling is improving.  We are going to hold off on injection today. -Regards the bunion we discussed both conservative as well as surgical care.  Ultimately he wants to have surgery.  We discussed surgical intervention for the bunion as well as the postoperative course.  He wants to consider doing this later this year for now continue to monitor.  Discussed shoe modifications, inserts  as well as offloading pads. -Patient encouraged to call the office with any questions, concerns, change in symptoms.    Vivi Barrack DPM

## 2018-08-15 ENCOUNTER — Encounter: Payer: Self-pay | Admitting: Gastroenterology

## 2019-07-25 ENCOUNTER — Encounter: Payer: Self-pay | Admitting: Gastroenterology

## 2019-09-05 ENCOUNTER — Other Ambulatory Visit: Payer: Self-pay

## 2019-09-05 ENCOUNTER — Encounter: Payer: Self-pay | Admitting: Gastroenterology

## 2019-09-05 ENCOUNTER — Ambulatory Visit (AMBULATORY_SURGERY_CENTER): Payer: Self-pay

## 2019-09-05 VITALS — Ht 76.0 in | Wt 211.0 lb

## 2019-09-05 DIAGNOSIS — Z1211 Encounter for screening for malignant neoplasm of colon: Secondary | ICD-10-CM

## 2019-09-05 MED ORDER — PLENVU 140 G PO SOLR: 1.0000 | ORAL | 0 refills | Status: DC

## 2019-09-05 NOTE — Progress Notes (Signed)
No egg or soy allergy known to patient  No issues with past sedation with any surgeries or procedures No intubation problems in the past  No FH of Malignant Hyperthermia No diet pills per patient No home 02 use per patient  No blood thinners per patient  Pt denies issues with constipation  No A fib or A flutter  EMMI video to pt   COVID 19 guidelines implemented in [facility] today with Pt and RN   Coupon given to pt in [facility] today , Code to Pharmacy  COVID vaccines completed on 02/2019 per pt;  Due to the COVID-19 pandemic we are asking patients to follow these guidelines. Please only bring one care partner. Please be aware that your care partner may wait in the car in the parking lot or if they feel like they will be too hot to wait in the car, they may wait in the lobby on the 4th floor. All care partners are required to wear a mask the entire time (we do not have any that we can provide them), they need to practice social distancing, and we will do a Covid check for all patient's and care partners when you arrive. Also we will check their temperature and your temperature. If the care partner waits in their car they need to stay in the parking lot the entire time and we will call them on their cell phone when the patient is ready for discharge so they can bring the car to the front of the building. Also all patient's will need to wear a mask into building.

## 2019-09-18 ENCOUNTER — Encounter: Payer: Self-pay | Admitting: Gastroenterology

## 2019-09-18 ENCOUNTER — Encounter (HOSPITAL_COMMUNITY): Payer: Self-pay | Admitting: Emergency Medicine

## 2019-09-18 ENCOUNTER — Ambulatory Visit: Payer: 59 | Admitting: Gastroenterology

## 2019-09-18 ENCOUNTER — Other Ambulatory Visit: Payer: Self-pay

## 2019-09-18 ENCOUNTER — Inpatient Hospital Stay (HOSPITAL_COMMUNITY)
Admission: EM | Admit: 2019-09-18 | Discharge: 2019-09-20 | DRG: 244 | Disposition: A | Discharge status: Home / Self Care | Payer: 59 | Attending: Internal Medicine | Admitting: Internal Medicine

## 2019-09-18 ENCOUNTER — Emergency Department (HOSPITAL_COMMUNITY): Payer: 59

## 2019-09-18 ENCOUNTER — Inpatient Hospital Stay (HOSPITAL_COMMUNITY): Payer: 59

## 2019-09-18 ENCOUNTER — Telehealth: Payer: Self-pay | Admitting: Internal Medicine

## 2019-09-18 VITALS — BP 134/80 | HR 38 | Temp 98.0°F | Resp 15 | Ht 76.0 in | Wt 211.0 lb

## 2019-09-18 DIAGNOSIS — Z959 Presence of cardiac and vascular implant and graft, unspecified: Secondary | ICD-10-CM

## 2019-09-18 DIAGNOSIS — I442 Atrioventricular block, complete: Secondary | ICD-10-CM | POA: Diagnosis present

## 2019-09-18 DIAGNOSIS — I10 Essential (primary) hypertension: Secondary | ICD-10-CM | POA: Diagnosis present

## 2019-09-18 DIAGNOSIS — E876 Hypokalemia: Secondary | ICD-10-CM | POA: Diagnosis present

## 2019-09-18 DIAGNOSIS — M109 Gout, unspecified: Secondary | ICD-10-CM | POA: Diagnosis present

## 2019-09-18 DIAGNOSIS — Z79899 Other long term (current) drug therapy: Secondary | ICD-10-CM

## 2019-09-18 DIAGNOSIS — Z20822 Contact with and (suspected) exposure to covid-19: Secondary | ICD-10-CM | POA: Diagnosis present

## 2019-09-18 DIAGNOSIS — F17210 Nicotine dependence, cigarettes, uncomplicated: Secondary | ICD-10-CM | POA: Diagnosis present

## 2019-09-18 DIAGNOSIS — E785 Hyperlipidemia, unspecified: Secondary | ICD-10-CM | POA: Diagnosis present

## 2019-09-18 DIAGNOSIS — Z1211 Encounter for screening for malignant neoplasm of colon: Secondary | ICD-10-CM

## 2019-09-18 DIAGNOSIS — I340 Nonrheumatic mitral (valve) insufficiency: Secondary | ICD-10-CM | POA: Diagnosis not present

## 2019-09-18 DIAGNOSIS — Z91040 Latex allergy status: Secondary | ICD-10-CM

## 2019-09-18 HISTORY — PX: COLONOSCOPY: SHX174

## 2019-09-18 LAB — COMPREHENSIVE METABOLIC PANEL
ALT: 16 U/L (ref 0–44)
AST: 28 U/L (ref 15–41)
Albumin: 3.9 g/dL (ref 3.5–5.0)
Alkaline Phosphatase: 49 U/L (ref 38–126)
Anion gap: 13 (ref 5–15)
BUN: 18 mg/dL (ref 8–23)
CO2: 21 mmol/L — ABNORMAL LOW (ref 22–32)
Calcium: 9.3 mg/dL (ref 8.9–10.3)
Chloride: 108 mmol/L (ref 98–111)
Creatinine, Ser: 1.19 mg/dL (ref 0.61–1.24)
GFR calc Af Amer: >60 mL/min (ref >60)
GFR calc non Af Amer: >60 mL/min (ref >60)
Glucose, Bld: 99 mg/dL (ref 70–99)
Potassium: 3.4 mmol/L — ABNORMAL LOW (ref 3.5–5.1)
Sodium: 142 mmol/L (ref 135–145)
Total Bilirubin: 0.6 mg/dL (ref 0.3–1.2)
Total Protein: 7.6 g/dL (ref 6.5–8.1)

## 2019-09-18 LAB — ECHOCARDIOGRAM COMPLETE
Height: 76 in
S' Lateral: 3.1 cm
Weight: 3375.68 oz

## 2019-09-18 LAB — CBC WITH DIFFERENTIAL/PLATELET
Abs Immature Granulocytes: 0.01 10*3/uL (ref 0.00–0.07)
Basophils Absolute: 0 10*3/uL (ref 0.0–0.1)
Basophils Relative: 1 %
Eosinophils Absolute: 0.1 10*3/uL (ref 0.0–0.5)
Eosinophils Relative: 2 %
HCT: 41.2 % (ref 39.0–52.0)
Hemoglobin: 13.9 g/dL (ref 13.0–17.0)
Immature Granulocytes: 0 %
Lymphocytes Relative: 36 %
Lymphs Abs: 2.3 10*3/uL (ref 0.7–4.0)
MCH: 30.5 pg (ref 26.0–34.0)
MCHC: 33.7 g/dL (ref 30.0–36.0)
MCV: 90.4 fL (ref 80.0–100.0)
Monocytes Absolute: 0.5 10*3/uL (ref 0.1–1.0)
Monocytes Relative: 8 %
Neutro Abs: 3.6 10*3/uL (ref 1.7–7.7)
Neutrophils Relative %: 53 %
Platelets: 221 10*3/uL (ref 150–400)
RBC: 4.56 MIL/uL (ref 4.22–5.81)
RDW: 13.1 % (ref 11.5–15.5)
WBC: 6.6 10*3/uL (ref 4.0–10.5)
nRBC: 0 % (ref 0.0–0.2)

## 2019-09-18 LAB — TSH: TSH: 2.932 u[IU]/mL (ref 0.350–4.500)

## 2019-09-18 LAB — TROPONIN I (HIGH SENSITIVITY)
Troponin I (High Sensitivity): 26 ng/L — ABNORMAL HIGH (ref <18)
Troponin I (High Sensitivity): 35 ng/L — ABNORMAL HIGH (ref <18)

## 2019-09-18 LAB — MAGNESIUM: Magnesium: 1.9 mg/dL (ref 1.7–2.4)

## 2019-09-18 LAB — SARS CORONAVIRUS 2 BY RT PCR (HOSPITAL ORDER, PERFORMED IN ~~LOC~~ HOSPITAL LAB): SARS Coronavirus 2: NEGATIVE

## 2019-09-18 MED ORDER — SODIUM CHLORIDE 0.9 % IV SOLN
500.0000 mL | Freq: Once | INTRAVENOUS | Status: DC
Start: 2019-09-18 — End: 2021-09-03

## 2019-09-18 MED ORDER — ACETAMINOPHEN 325 MG PO TABS: 650.0000 mg | ORAL_TABLET | ORAL | Status: DC | PRN

## 2019-09-18 MED ORDER — NITROGLYCERIN 0.4 MG SL SUBL: 0.4000 mg | SUBLINGUAL_TABLET | SUBLINGUAL | Status: DC | PRN

## 2019-09-18 MED ORDER — POTASSIUM CHLORIDE CRYS ER 20 MEQ PO TBCR
40.0000 meq | EXTENDED_RELEASE_TABLET | Freq: Once | ORAL | Status: AC
  Administered 2019-09-18: 40 meq via ORAL
  Filled 2019-09-18: qty 2

## 2019-09-18 MED ORDER — SODIUM CHLORIDE 0.9 % IV SOLN: INTRAVENOUS | Status: DC

## 2019-09-18 MED ORDER — ALLOPURINOL 300 MG PO TABS
300.0000 mg | ORAL_TABLET | Freq: Every day | ORAL | Status: DC
  Administered 2019-09-19 – 2019-09-20 (×2): 300 mg via ORAL
  Filled 2019-09-18 (×2): qty 1

## 2019-09-18 NOTE — ED Provider Notes (Signed)
[facility] EMERGENCY DEPARTMENT Provider Note   CSN: [id number] Arrival date & time: 09/18/19  1133     History Chief Complaint  Patient presents with  . heart block    Jose Cook is a 64 y.o. male.  HPI Patient was transferred here by EMS for evaluation of complete heart block.  He was being readied for endoscopy this morning when this was noted.  He took amlodipine, today, but no other medicines this morning.  He denies any episodes of dizziness or syncope.  He denies chest pain, shortness of breath, fever, chills, head, neck or back pain.  No prior similar problems.  There are no other known modifying factors.    Past Medical History:  Diagnosis Date  . Allergy    seasonal allergies  . Gout   . HTN (hypertension)    on meds  . Hyperlipidemia    on meds    Patient Active Problem List   Diagnosis Date Noted  . Gout 07/17/2012    Past Surgical History:  Procedure Laterality Date  . COLONOSCOPY  2010   normal-recall 58yrs  . COLONOSCOPY  09/18/2019  . KNEE SURGERY Left    x 2 sx  . ROTATOR CUFF REPAIR Left        Family History  Problem Relation Age of Onset  . Lymphoma Father 20  . Colon polyps Neg Hx   . Colon cancer Neg Hx   . Esophageal cancer Neg Hx   . Rectal cancer Neg Hx   . Stomach cancer Neg Hx     Social History   Tobacco Use  . Smoking status: Current Every Day Smoker    Packs/day: 1.00    Years: 20.00    Pack years: 20.00    Types: Cigarettes  . Smokeless tobacco: Never Used  Vaping Use  . Vaping Use: Never used  Substance Use Topics  . Alcohol use: Yes    Comment: 1-2 6 packs every 2 days  . Drug use: No    Home Medications Prior to Admission medications   Medication Sig Start Date End Date Taking? Authorizing Provider  allopurinol (ZYLOPRIM) 300 MG tablet Take 300 mg by mouth daily.  04/07/18  Yes [provider]  amLODipine (NORVASC) 10 MG tablet Take 10 mg by mouth daily. 08/16/19  Yes  [provider]  gemfibrozil (LOPID) 600 MG tablet Take 600 mg by mouth daily.  08/25/19  Yes [provider]  Menthol, Topical Analgesic, (BIOFREEZE EX) Apply 2 sprays topically 3 (three) times daily as needed (pain).   Yes [provider]  NAPROXEN PO Take 1-2 tablets by mouth 2 (two) times daily as needed (pain).   Yes [provider]  sildenafil (VIAGRA) 100 MG tablet Take 50-100 mg by mouth daily as needed for erectile dysfunction.  09/02/19  Yes [provider]  terazosin (HYTRIN) 5 MG capsule Take 5 mg by mouth at bedtime. Patient not taking: Reported on 09/18/2019 09/12/19   [provider]    Allergies    Latex  Review of Systems   Review of Systems  All other systems reviewed and are negative.   Physical Exam Updated Vital Signs BP (!) 178/76   Pulse (!) 37   Temp 98.5 F (36.9 C) (Oral)   Resp 14   Ht 6\' 4"  (1.93 m)   Wt 95.7 kg   SpO2 99%   BMI 25.68 kg/m   Physical Exam Vitals and nursing note reviewed.  Constitutional:      General: He is not in acute distress.    Appearance: He is well-developed. He is not ill-appearing, toxic-appearing or diaphoretic.  HENT:     Head: Normocephalic and atraumatic.     Right Ear: External ear normal.     Left Ear: External ear normal.  Eyes:     Conjunctiva/sclera: Conjunctivae normal.     Pupils: Pupils are equal, round, and reactive to light.  Neck:     Trachea: Phonation normal.  Cardiovascular:     Rate and Rhythm: Regular rhythm. Bradycardia present.     Heart sounds: Normal heart sounds.  Pulmonary:     Effort: Pulmonary effort is normal.     Breath sounds: Normal breath sounds.  Abdominal:     General: There is no distension.     Palpations: Abdomen is soft.     Tenderness: There is no abdominal tenderness.  Musculoskeletal:        General: Normal range of motion.     Cervical back: Normal range of motion and neck supple.  Skin:    General: Skin is warm  and dry.  Neurological:     Mental Status: He is alert and oriented to person, place, and time.     Cranial Nerves: No cranial nerve deficit.     Sensory: No sensory deficit.     Motor: No abnormal muscle tone.     Coordination: Coordination normal.  Psychiatric:        Mood and Affect: Mood normal.        Behavior: Behavior normal.        Thought Content: Thought content normal.        Judgment: Judgment normal.     ED Results / Procedures / Treatments   Labs (all labs ordered are listed, but only abnormal results are displayed) Labs Reviewed  COMPREHENSIVE METABOLIC PANEL - Abnormal; Notable for the following components:      Result Value   Potassium 3.4 (*)    CO2 21 (*)    All other components within normal limits  TROPONIN I (HIGH SENSITIVITY) - Abnormal; Notable for the following components:   Troponin I (High Sensitivity) 26 (*)    All other components within normal limits  SARS CORONAVIRUS 2 BY RT PCR (HOSPITAL ORDER, PERFORMED IN [facility])  CBC WITH DIFFERENTIAL/PLATELET  TROPONIN I (HIGH SENSITIVITY)    EKG EKG Interpretation  Date/Time:  Tuesday September 18 2019 11:41:21 EDT Ventricular Rate:  40 PR Interval:    QRS Duration: 139 QT Interval:  646 QTC Calculation: 527 R Axis:   -72 Text Interpretation: AV block, complete (third degree) RBBB and LAFB Left ventricular hypertrophy Abnrm T, consider ischemia, anterolateral lds ST elevation, consider inferior injury Since last tracing rate slower , completeAV  heart block , and bifasicular block present Confirmed by Mancel Bale [phone]) on 09/18/2019 1:02:22 PM   Radiology DG Chest Port 1 View  Result Date: 09/18/2019 CLINICAL DATA:  Abnormal EKG. EXAM: PORTABLE CHEST 1 VIEW COMPARISON:  07/25/2008. FINDINGS: Mediastinum and hilar structures normal. Borderline cardiomegaly. No pulmonary venous congestion. No focal infiltrate. No pleural effusion or pneumothorax. Left costophrenic angle incompletely  imaged. No acute bony abnormality. IMPRESSION: Borderline cardiomegaly. No pulmonary venous congestion. No acute pulmonary disease. Electronically Signed   By: Maisie Fus  Register   On: 09/18/2019 12:08    Procedures .Critical Care Performed by: Mancel Bale, MD Authorized by: Mancel Bale, MD   Critical care provider statement:  Critical care time (minutes):  35   Critical care start time:  09/18/2019 11:30 AM   Critical care end time:  09/18/2019 1:13 PM   Critical care time was exclusive of:  Separately billable procedures and treating other patients   Critical care was necessary to treat or prevent imminent or life-threatening deterioration of the following conditions:  Cardiac failure   Critical care was time spent personally by me on the following activities:  Blood draw for specimens, development of treatment plan with patient or surrogate, discussions with consultants, evaluation of patient's response to treatment, examination of patient, obtaining history from patient or surrogate, ordering and performing treatments and interventions, ordering and review of laboratory studies, pulse oximetry, re-evaluation of patient's condition, review of old charts and ordering and review of radiographic studies   (including critical care time)  Medications Ordered in ED Medications  0.9 %  sodium chloride infusion ( Intravenous New Bag/Given 09/18/19 1210)    ED Course  I have reviewed the triage vital signs and the nursing notes.  Pertinent labs & imaging results that were available during my care of the patient were reviewed by me and considered in my medical decision making (see chart for details).  Clinical Course as of Sep 18 1310  Tue Sep 18, 2019  1255 Normal  CBC with Differential [EW]  1255 Mild elevation  Troponin I (High Sensitivity)(!) [EW]  1255 Normal except potassium low, CO2 low  Comprehensive metabolic panel(!) [EW]  1255 I discussed the case with cardiology who will  see the patient in the ED for evaluation, treatment and disposition.   [EW]    Clinical Course User Index [EW] Mancel BaleWentz, Marne Meline, MD   MDM Rules/Calculators/A&P                           Patient Vitals for the past 24 hrs:  BP Temp Temp src Pulse Resp SpO2 Height Weight  09/18/19 1245 (!) 178/76 -- -- (!) 37 14 99 % -- --  09/18/19 1230 (!) 181/74 -- -- (!) 42 17 92 % -- --  09/18/19 1215 (!) 174/72 -- -- (!) 39 13 99 % -- --  09/18/19 1200 (!) 192/82 -- -- (!) 40 12 99 % -- --  09/18/19 1143 (!) 173/72 98.5 F (36.9 C) Oral (!) 39 14 96 % -- --  09/18/19 1139 -- -- -- -- -- -- 6\' 4"  (1.93 m) 95.7 kg    12:57 PM Reevaluation with update and discussion. After initial assessment and treatment, an updated evaluation reveals he continues to be bradycardic and asymptomatic.  Findings discussed with the patient all questions were answered. Mancel BaleElliott Niambi Smoak   Medical Decision Making:  This patient is presenting for evaluation of complete heart block with bradycardia, which does require a range of treatment options, and is a complaint that involves a high risk of morbidity and mortality. The differential diagnoses include, ACS, metabolic disorder. I decided to review old records, and in summary healthy male presented today for endoscopy, and unexpectedly found to be in complete heart block with rates in the 30s..  I did not require additional historical information from anyone.  Clinical Laboratory Tests Ordered, included CBC, Metabolic panel and Troponin testing. Review indicates normal except mild troponin elevation on initial screen, delta pending. Radiologic Tests Ordered, included chest x-ray.  I independently Visualized: Radiographic images, which show no infiltrate or edema  Cardiac Monitor Tracing which shows complete heart block with rates  in the mid 30s    Critical Interventions-clinical evaluation, laboratory testing, chest x-ray, observation reassessment.  The patient did not  require cardiac pacing, in the emergency department because he was asymptomatic.  After These Interventions, the Patient was reevaluated and was found stable, requiring hospitalization for complete heart block, without evidence for acute coronary syndrome, metabolic disorder causing low heart rate or hemodynamic instability.  He requires urgent cardiology consultation and likely pacemaker placement prior to disposition from the hospital.  CRITICAL CARE-yes Performed by: Mancel Bale  Nursing Notes Reviewed/ Care Coordinated Applicable Imaging Reviewed Interpretation of Laboratory Data incorporated into ED treatment  Plan: Cardiology to evaluate and treat for complete heart block.    Final Clinical Impression(s) / ED Diagnoses Final diagnoses:  Complete heart block [facility])    Rx / DC Orders ED Discharge Orders    None       Mancel Bale, MD 09/18/19 1314

## 2019-09-18 NOTE — Progress Notes (Signed)
Echocardiogram 2D Echocardiogram has been performed.  Warren Lacy Carmesha Morocco 09/18/2019, 3:45 PM

## 2019-09-18 NOTE — Progress Notes (Signed)
Pt's states no medical or surgical changes since previsit or office visit. Vitals per SF  855--Inform Dr Myrtie Neither and Gilmore Laroche CRNA of pt's HR of 40 and that he is asymptomatic, pt is also unsure of meds he is taking and states his dr had recently changed his bp meds and he has stopped some and started others and is not sure of the names.

## 2019-09-18 NOTE — H&P (Addendum)
Cardiology Admission History and Physical:   Patient ID: Jose Cook MRN: [id number]; DOB: April 16, 1955   Admission date: 09/18/2019  Primary Care Provider: Ileana Ladd, MD [facility] Cardiologist: None [facility] Electrophysiologist:  None   Chief Complaint:  CHB  Patient Profile:   Jose Cook is a 64 y.o. male with HTN, gout, HTN, HLD referred to the ER for CHB noted at the endoscopy center  History of Present Illness:   Jose Cook was scheduled today for a colonoscopy he and his wife state with some unintential weight loss the last year, and was due to have done.  There he was noted to be bradycardic and found in CHB.  The GI MD reached out to [facility] DOD Dr. Rennis Golden reviewed the EKG, noting no nodal blockers recommended transfer to the ER  LABS K+ 3.4 BUN/Creat 18/1.19 HS Trop 26 WBC 6.6 H/H 13/41 Plts 221  The patient reports no symptoms.  No CP, palpitations or SOB of any kind of cardiac awareness. 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  Home meds reviewed without nodal blocking agents  The patient had done his bowel prep and reports up all night going to that bathroom, this is his only physical complaint  He does have a watch that monitors his HR and notes his HR 58-60's at  Rest, he has never seen HR 30's    Past Medical History:  Diagnosis Date  . Allergy    seasonal allergies  . Gout   . HTN (hypertension)    on meds  . Hyperlipidemia    on meds    Past Surgical History:  Procedure Laterality Date  . COLONOSCOPY  2010   normal-recall 33yrs  . COLONOSCOPY  09/18/2019  . KNEE SURGERY Left    x 2 sx  . ROTATOR CUFF REPAIR Left      Medications Prior to Admission: Prior to Admission medications   Medication Sig Start Date  End Date Taking? Authorizing Provider  allopurinol (ZYLOPRIM) 300 MG tablet Take 300 mg by mouth daily.  04/07/18  Yes [provider]  amLODipine (NORVASC) 10 MG tablet Take 10 mg by mouth daily. 08/16/19  Yes [provider]  gemfibrozil (LOPID) 600 MG tablet Take 600 mg by mouth daily.  08/25/19  Yes [provider]  Menthol, Topical Analgesic, (BIOFREEZE EX) Apply 2 sprays topically 3 (three) times daily as needed (pain).   Yes [provider]  NAPROXEN PO Take 1-2 tablets by mouth 2 (two) times daily as needed (pain).   Yes [provider]  sildenafil (VIAGRA) 100 MG tablet Take 50-100 mg by mouth daily as needed for erectile dysfunction.  09/02/19  Yes [provider]  terazosin (HYTRIN) 5 MG capsule Take 5 mg by mouth at bedtime. Patient not taking: Reported on 09/18/2019 09/12/19   [provider]     Allergies:    Allergies  Allergen Reactions  . Latex Dermatitis    Social History:   Social History   Socioeconomic History  . Marital status: Married    Spouse name: Not on file  . Number of children: Not on file  . Years of education: Not on file  . Highest education level: Not on file  Occupational History  .  Not on file  Tobacco Use  . Smoking status: Current Every Day Smoker    Packs/day: 1.00    Years: 20.00    Pack years: 20.00    Types: Cigarettes  . Smokeless tobacco: Never Used  Vaping Use  . Vaping Use: Never used  Substance and Sexual Activity  . Alcohol use: Yes    Comment: 1-2 6 packs every 2 days  . Drug use: No  . Sexual activity: Yes  Other Topics Concern  . Not on file  Social History Narrative  . Not on file   Social Determinants of Health   Financial Resource Strain:   . Difficulty of Paying Living Expenses: Not on file  Food Insecurity:   . Worried About Programme researcher, broadcasting/film/video in the Last Year: Not on file  . Ran Out of Food in the Last Year: Not on file  Transportation Needs:   .  Lack of Transportation (Medical): Not on file  . Lack of Transportation (Non-Medical): Not on file  Physical Activity:   . Days of Exercise per Week: Not on file  . Minutes of Exercise per Session: Not on file  Stress:   . Feeling of Stress : Not on file  Social Connections:   . Frequency of Communication with Friends and Family: Not on file  . Frequency of Social Gatherings with Friends and Family: Not on file  . Attends Religious Services: Not on file  . Active Member of Clubs or Organizations: Not on file  . Attends Banker Meetings: Not on file  . Marital Status: Not on file  Intimate Partner Violence:   . Fear of Current or Ex-Partner: Not on file  . Emotionally Abused: Not on file  . Physically Abused: Not on file  . Sexually Abused: Not on file    Family History:   The patient's family history includes Lymphoma (age of onset: 20) in his father. There is no history of Colon polyps, Colon cancer, Esophageal cancer, Rectal cancer, or Stomach cancer.    ROS:  Please see the history of present illness.  All other ROS reviewed and negative.     Physical Exam/Data:   Vitals:   09/18/19 1200 09/18/19 1215 09/18/19 1230 09/18/19 1245  BP: (!) 192/82 (!) 174/72 (!) 181/74 (!) 178/76  Pulse: (!) 40 (!) 39 (!) 42 (!) 37  Resp: 12 13 17 14   Temp:      TempSrc:      SpO2: 99% 99% 92% 99%  Weight:      Height:        Intake/Output Summary (Last 24 hours) at 09/18/2019 1400 Last data filed at 09/18/2019 1136 Gross per 24 hour  Intake 200 ml  Output --  Net 200 ml   Last 3 Weights 09/18/2019 09/18/2019 09/05/2019  Weight (lbs) 210 lb 15.7 oz 211 lb 211 lb  Weight (kg) 95.7 kg 95.709 kg 95.709 kg     Body mass index is 25.68 kg/m.  General:  Well nourished, well developed, in no acute distress HEENT: normal Lymph: no adenopathy Neck: no JVD Endocrine:  No thryomegaly Vascular: No carotid bruits Cardiac: RRR; bradycardic, no murmurs, gallops or rubs Lungs:   CTA b/l, no wheezing, rhonchi or rales  Abd: soft, nontender Ext: no edema Musculoskeletal:  No deformities Skin: warm and dry  Neuro:  no focal abnormalities noted Psych:  Normal affect    EKG:  The ECG that was done today was personally reviewed and demonstrates  CHB, RBBBm 40bpm  06/26/2014: SR, QRS 99bpm, PR 160ms  Relevant CV Studies:  No historical cardiac data  Laboratory Data:  High Sensitivity Troponin:   Recent Labs  Lab 09/18/19 1146  TROPONINIHS 26*      Chemistry Recent Labs  Lab 09/18/19 1146  NA 142  K 3.4*  CL 108  CO2 21*  GLUCOSE 99  BUN 18  CREATININE 1.19  CALCIUM 9.3  GFRNONAA >60  GFRAA >60  ANIONGAP 13    Recent Labs  Lab 09/18/19 1146  PROT 7.6  ALBUMIN 3.9  AST 28  ALT 16  ALKPHOS 49  BILITOT 0.6   Hematology Recent Labs  Lab 09/18/19 1146  WBC 6.6  RBC 4.56  HGB 13.9  HCT 41.2  MCV 90.4  MCH 30.5  MCHC 33.7  RDW 13.1  PLT 221   BNPNo results for input(s): BNP, PROBNP in the last 168 hours.  DDimer No results for input(s): DDIMER in the last 168 hours.   Radiology/Studies:   DG Chest Port 1 View Result Date: 09/18/2019 CLINICAL DATA:  Abnormal EKG. EXAM: PORTABLE CHEST 1 VIEW COMPARISON:  07/25/2008. FINDINGS: Mediastinum and hilar structures normal. Borderline cardiomegaly. No pulmonary venous congestion. No focal infiltrate. No pleural effusion or pneumothorax. Left costophrenic angle incompletely imaged. No acute bony abnormality. IMPRESSION: Borderline cardiomegaly. No pulmonary venous congestion. No acute pulmonary disease. Electronically Signed   By: Maisie Fushomas  Register   On: 09/18/2019 12:08   {  Assessment and Plan:   1. CHB     HR 38-40     asymptomatic     BP stable  No cardiac awareness or symptoms of any kind of late I have briefly discussed the case with Dr. Johney FrameAllred, given no symptoms, and hemodynamically stable, maybe a possibility is vagally driven with GI prep and we have a window to monitor him to  see if resolved.  We will admit to telemetry and if CHB unresolved plan pacing tomorrow  2. HTN     Monitor, no meds for now   3. Mild hypokalemia     Replacement ordered   He is fully vaccinated with no symptoms of COVID, no test done for his colonoscpy    Severity of Illness: The appropriate patient status for this patient is INPATIENT. Inpatient status is judged to be reasonable and necessary in order to provide the required intensity of service to ensure the patient's safety. The patient's presenting symptoms, physical exam findings, and initial radiographic and laboratory data in the context of their chronic comorbidities is felt to place them at high risk for further clinical deterioration. Furthermore, it is not anticipated that the patient will be medically stable for discharge from the hospital within 2 midnights of admission. The following factors support the patient status of inpatient.   CHB  * I certify that at the point of admission it is my clinical judgment that the patient will require inpatient hospital care spanning beyond 2 midnights from the point of admission due to high intensity of service, high risk for further deterioration and high frequency of surveillance required.*    For questions or updates, please contact [facility] Please consult [email or web address] for contact info under     Signed, Sheilah PigeonRenee Lynn Ursuy, PA-C  09/18/2019 2:00 PM    I have seen, examined the patient, and reviewed the above assessment and plan.  Changes to above are made where necessary.  On exam, bradycardic rhythm.  Very pleasant gentleman with complete heart block noticed  on presentation for colonoscopy after colon prep.  Though I suspect that his AV Block will persist, I think that it is prudent to observe overnight.  If his conduction dose not improve, we will plan PPM for tomorrow.   We will also obtain a cardiac MRI to evaluate for other causes of his AV nodal conduction system  disease.   Co Sign: Hillis Range, MD

## 2019-09-18 NOTE — Progress Notes (Signed)
Pt's rhythm was abnormal upon arriving to the room. P waves are not at a normal interval, and do not line up with QRS, HR is 38. Discussed this finding with MD. 12 lead ordered to rule out complete heart block / other arrhythmias.

## 2019-09-18 NOTE — Progress Notes (Signed)
Patient remains stable with BP 182/76.  Lungs clear to auscultation bilaterally Alert and conversational.  Wife at bedside.  12 lead EKG showing apparent 3rd degree heart block.  Temporary pacer pads placed - good quality rhythm strip obtained also showing 3rd degree AV block.  I spoke to Dr. Zoila Shutter , Doc of Day [facility] and sent EKG electronically for his review.  He confirmed 3rd degree AV block and recommended EMS transfer to [facility].  Dr. Rennis Golden planned to call his partner on call at [facility] today to alert them about this case.

## 2019-09-18 NOTE — Progress Notes (Signed)
EMS notified for transport,informed operator that pt. Will need  heart moinitor will need to be transported to [facility],wife  remains at bedside and kept informed about pt. Status.  10:57 -EMS arrived for transport via strecther,copy of EKG given to EMS. Call placed to [place],spoke with Stacy to give a report.

## 2019-09-18 NOTE — Telephone Encounter (Signed)
Called and spoke with Dr. Myrtie Neither - reviewed EKG which is c/w CHB. Patient is asymptomatic, but directed to ER for evaluation. Not on AVN blocking meds.  Dr Rexene Edison

## 2019-09-18 NOTE — ED Triage Notes (Addendum)
Pt here from  GI (scheduled colonoscopy) via [facility] and found to be in a 3rd degree heartb lock. Pt has no cardiac hx, hx of HTN and gout. Pt asymptomatic,  AOx4, VSS. Pt took amlodipine this am.

## 2019-09-18 NOTE — Progress Notes (Signed)
Patient evaluated in procedure room after CRNA assessment.  Patient bradycardic, pulse 38-42.   PR intervals variable without clear pattern.  Patient without chest pain, dyspnea, or reported lightheadedness with standing or walking.  He tells [place] PCP Leodis Sias) recently resumed amlodipine, he is also on losartan and NO beta blockers.  Patient brought back to pre-procedure area for 12-lead EKG.

## 2019-09-18 NOTE — Progress Notes (Signed)
Patient remains stable.  EMS has arrived and I spoke with them.  Patient being prepped for transfer to [facility]  Patient and wife updated.

## 2019-09-18 NOTE — ED Notes (Signed)
Per [facility], patient was suppose to get an endoscopy-states they hooked him up to monitor and it showed a complete heart block-Dr [facility], cardiology, told GI MD to send him to [facility] ED for eval

## 2019-09-19 ENCOUNTER — Inpatient Hospital Stay (HOSPITAL_COMMUNITY): Payer: 59

## 2019-09-19 ENCOUNTER — Encounter (HOSPITAL_COMMUNITY)
Admission: EM | Disposition: A | Discharge status: Home / Self Care | Payer: Self-pay | Source: Home / Self Care | Attending: Internal Medicine

## 2019-09-19 ENCOUNTER — Encounter (HOSPITAL_COMMUNITY): Payer: Self-pay | Admitting: Internal Medicine

## 2019-09-19 DIAGNOSIS — I442 Atrioventricular block, complete: Secondary | ICD-10-CM

## 2019-09-19 HISTORY — PX: PACEMAKER IMPLANT: EP1218

## 2019-09-19 LAB — BASIC METABOLIC PANEL
Anion gap: 11 (ref 5–15)
BUN: 15 mg/dL (ref 8–23)
CO2: 23 mmol/L (ref 22–32)
Calcium: 9 mg/dL (ref 8.9–10.3)
Chloride: 106 mmol/L (ref 98–111)
Creatinine, Ser: 1.15 mg/dL (ref 0.61–1.24)
GFR calc Af Amer: >60 mL/min (ref >60)
GFR calc non Af Amer: >60 mL/min (ref >60)
Glucose, Bld: 110 mg/dL — ABNORMAL HIGH (ref 70–99)
Potassium: 3.1 mmol/L — ABNORMAL LOW (ref 3.5–5.1)
Sodium: 140 mmol/L (ref 135–145)

## 2019-09-19 LAB — SURGICAL PCR SCREEN
MRSA, PCR: NEGATIVE
Staphylococcus aureus: POSITIVE — AB

## 2019-09-19 SURGERY — PACEMAKER IMPLANT
Anesthesia: LOCAL

## 2019-09-19 MED ORDER — ONDANSETRON HCL 4 MG/2ML IJ SOLN: 4.0000 mg | Freq: Four times a day (QID) | INTRAMUSCULAR | Status: DC | PRN

## 2019-09-19 MED ORDER — CEFAZOLIN SODIUM-DEXTROSE 2-4 GM/100ML-% IV SOLN
2.0000 g | INTRAVENOUS | Status: AC
  Administered 2019-09-19: 2 g via INTRAVENOUS

## 2019-09-19 MED ORDER — ACETAMINOPHEN 325 MG PO TABS
325.0000 mg | ORAL_TABLET | ORAL | Status: DC | PRN
  Administered 2019-09-19 – 2019-09-20 (×3): 650 mg via ORAL
  Filled 2019-09-19 (×3): qty 2

## 2019-09-19 MED ORDER — LIDOCAINE HCL (PF) 1 % IJ SOLN
INTRAMUSCULAR | Status: DC | PRN
  Administered 2019-09-19: 60 mL

## 2019-09-19 MED ORDER — SODIUM CHLORIDE 0.9 % IV SOLN
250.0000 mL | INTRAVENOUS | Status: DC
  Administered 2019-09-19: 1000 mL via INTRAVENOUS

## 2019-09-19 MED ORDER — FENTANYL CITRATE (PF) 100 MCG/2ML IJ SOLN
INTRAMUSCULAR | Status: DC | PRN
Start: 2019-09-19 — End: 2019-09-19
  Administered 2019-09-19: 25 ug via INTRAVENOUS
  Administered 2019-09-19: 50 ug via INTRAVENOUS

## 2019-09-19 MED ORDER — CEFAZOLIN SODIUM-DEXTROSE 2-4 GM/100ML-% IV SOLN
INTRAVENOUS | Status: AC
  Filled 2019-09-19: qty 100

## 2019-09-19 MED ORDER — HEPARIN (PORCINE) IN NACL 1000-0.9 UT/500ML-% IV SOLN
INTRAVENOUS | Status: AC
  Filled 2019-09-19: qty 1000

## 2019-09-19 MED ORDER — SODIUM CHLORIDE 0.9% FLUSH: 3.0000 mL | INTRAVENOUS | Status: DC | PRN

## 2019-09-19 MED ORDER — SODIUM CHLORIDE 0.9 % IV SOLN
80.0000 mg | INTRAVENOUS | Status: AC
  Administered 2019-09-19: 80 mg
  Filled 2019-09-19: qty 2

## 2019-09-19 MED ORDER — MIDAZOLAM HCL 5 MG/5ML IJ SOLN
INTRAMUSCULAR | Status: AC
  Filled 2019-09-19: qty 5

## 2019-09-19 MED ORDER — POTASSIUM CHLORIDE CRYS ER 20 MEQ PO TBCR
40.0000 meq | EXTENDED_RELEASE_TABLET | Freq: Once | ORAL | Status: AC
  Administered 2019-09-19: 40 meq via ORAL

## 2019-09-19 MED ORDER — IOHEXOL 350 MG/ML SOLN
INTRAVENOUS | Status: DC | PRN
  Administered 2019-09-19: 20 mL

## 2019-09-19 MED ORDER — GADOBUTROL 1 MMOL/ML IV SOLN
10.0000 mL | Freq: Once | INTRAVENOUS | Status: AC | PRN
  Administered 2019-09-19: 10 mL via INTRAVENOUS

## 2019-09-19 MED ORDER — METHYLPREDNISOLONE SODIUM SUCC 125 MG IJ SOLR
INTRAMUSCULAR | Status: AC
  Filled 2019-09-19: qty 2

## 2019-09-19 MED ORDER — METHYLPREDNISOLONE SODIUM SUCC 125 MG IJ SOLR
INTRAMUSCULAR | Status: DC | PRN
  Administered 2019-09-19: 125 mg via INTRAVENOUS

## 2019-09-19 MED ORDER — CHLORHEXIDINE GLUCONATE 4 % EX LIQD: 60.0000 mL | Freq: Once | CUTANEOUS | Status: DC

## 2019-09-19 MED ORDER — HEPARIN (PORCINE) IN NACL 1000-0.9 UT/500ML-% IV SOLN
INTRAVENOUS | Status: DC | PRN
  Administered 2019-09-19: 500 mL

## 2019-09-19 MED ORDER — SODIUM CHLORIDE 0.9 % IV SOLN
INTRAVENOUS | Status: DC
  Administered 2019-09-19: 1000 mL via INTRAVENOUS

## 2019-09-19 MED ORDER — SODIUM CHLORIDE 0.9% FLUSH
3.0000 mL | Freq: Two times a day (BID) | INTRAVENOUS | Status: DC
  Administered 2019-09-19: 3 mL via INTRAVENOUS

## 2019-09-19 MED ORDER — SODIUM CHLORIDE 0.9 % IV SOLN
INTRAVENOUS | Status: AC
  Filled 2019-09-19: qty 2

## 2019-09-19 MED ORDER — CHLORHEXIDINE GLUCONATE 4 % EX LIQD
60.0000 mL | Freq: Once | CUTANEOUS | Status: DC
  Filled 2019-09-19: qty 60

## 2019-09-19 MED ORDER — MIDAZOLAM HCL 5 MG/5ML IJ SOLN
INTRAMUSCULAR | Status: DC | PRN
  Administered 2019-09-19: 2 mg via INTRAVENOUS
  Administered 2019-09-19: 1 mg via INTRAVENOUS

## 2019-09-19 MED ORDER — FENTANYL CITRATE (PF) 100 MCG/2ML IJ SOLN
INTRAMUSCULAR | Status: AC
  Filled 2019-09-19: qty 2

## 2019-09-19 MED ORDER — CEFAZOLIN SODIUM-DEXTROSE 1-4 GM/50ML-% IV SOLN
1.0000 g | Freq: Four times a day (QID) | INTRAVENOUS | Status: AC
  Administered 2019-09-19 – 2019-09-20 (×3): 1 g via INTRAVENOUS
  Filled 2019-09-19 (×4): qty 50

## 2019-09-19 MED ORDER — DIPHENHYDRAMINE HCL 50 MG/ML IJ SOLN
INTRAMUSCULAR | Status: AC
  Filled 2019-09-19: qty 1

## 2019-09-19 MED ORDER — POTASSIUM CHLORIDE CRYS ER 20 MEQ PO TBCR
40.0000 meq | EXTENDED_RELEASE_TABLET | ORAL | Status: AC
  Administered 2019-09-19: 40 meq via ORAL
  Filled 2019-09-19 (×2): qty 2

## 2019-09-19 MED ORDER — DIPHENHYDRAMINE HCL 50 MG/ML IJ SOLN
INTRAMUSCULAR | Status: DC | PRN
  Administered 2019-09-19: 25 mg via INTRAVENOUS

## 2019-09-19 SURGICAL SUPPLY — 10 items
CABLE SURGICAL S-101-97-12 (CABLE) ×2 IMPLANT
HEMOSTAT SURGICEL 2X4 FIBR (HEMOSTASIS) ×1 IMPLANT
IPG PACE AZUR XT DR MRI W1DR01 (Pacemaker) IMPLANT
KIT MICROPUNCTURE NIT STIFF (SHEATH) ×1 IMPLANT
LEAD CAPSURE NOVUS 5076-52CM (Lead) ×1 IMPLANT
LEAD CAPSURE NOVUS 5076-58CM (Lead) ×1 IMPLANT
PACE AZURE XT DR MRI W1DR01 (Pacemaker) ×2 IMPLANT
PAD PRO RADIOLUCENT 2001M-C (PAD) ×2 IMPLANT
SHEATH 7FR PRELUDE SNAP 13 (SHEATH) ×2 IMPLANT
TRAY PACEMAKER INSERTION (PACKS) ×2 IMPLANT

## 2019-09-19 NOTE — Progress Notes (Signed)
This note also relates to the following rows which could not be included: ECG Heart Rate - Cannot attach notes to unvalidated device data    09/19/19 0500  Assess: MEWS Score  Temp 98.1 F (36.7 C)  BP (!) 155/81  Pulse Rate (!) 37  Resp 18  SpO2 100 %  O2 Device Room Air  Assess: MEWS Score  MEWS Temp 0  MEWS Systolic 0  MEWS Pulse 2  MEWS RR 0  MEWS LOC 0  MEWS Score 2  MEWS Score Color Yellow  Assess: if the MEWS score is Yellow or Red  Were vital signs taken at a resting state? Yes  Focused Assessment No change from prior assessment  Early Detection of Sepsis Score *See Row Information* Low  MEWS guidelines implemented *See Row Information* Yes  Treat  MEWS Interventions Escalated (See documentation below)  Take Vital Signs  Increase Vital Sign Frequency  Yellow: Q 2hr X 2 then Q 4hr X 2, if remains yellow, continue Q 4hrs  Escalate  MEWS: Escalate Yellow: discuss with charge nurse/RN and consider discussing with provider and RRT  Notify: Charge Nurse/RN  Name of Charge Nurse/RN Notified Sallee Lange, RN  Date Charge Nurse/RN Notified 09/19/19  Time Charge Nurse/RN Notified 0500  Document  Patient Outcome Other (Comment) (no interventions)  Progress note created (see row info) Yes

## 2019-09-19 NOTE — H&P (Signed)
Please see note labeled consult done 09/18/2019 to serve as H&P  Francis Dowse, PA-C

## 2019-09-19 NOTE — Plan of Care (Signed)

## 2019-09-19 NOTE — Progress Notes (Addendum)
Progress Note  Patient Name: Jose GAVITT Date of Encounter: 09/19/2019  [facility] Cardiologist: new to [facility]  Subjective   No complaints other then hungry  Inpatient Medications    Scheduled Meds:  allopurinol  300 mg Oral Daily   Continuous Infusions:  sodium chloride 50 mL/hr at 09/18/19 1210   PRN Meds: acetaminophen, nitroGLYCERIN   Vital Signs    Vitals:   09/18/19 2227 09/19/19 0500 09/19/19 0711 09/19/19 0847  BP: (!) 175/82 (!) 155/81 (!) 154/78 (!) 153/80  Pulse: (!) 49 (!) 37  (!) 42  Resp: 15 18 17 17   Temp: 99.4 F (37.4 C) 98.1 F (36.7 C) 98.1 F (36.7 C) 98.1 F (36.7 C)  TempSrc: Oral Oral Oral   SpO2: 100% 100% 100% 100%  Weight: 94 kg     Height: 6\' 4"  (1.93 m)       Intake/Output Summary (Last 24 hours) at 09/19/2019 0929 Last data filed at 09/19/2019 0300 Gross per 24 hour  Intake 440 ml  Output --  Net 440 ml   Last 3 Weights 09/18/2019 09/18/2019 09/18/2019  Weight (lbs) 207 lb 3.7 oz 207 lb 3.7 oz 210 lb 15.7 oz  Weight (kg) 94 kg 94 kg 95.7 kg      Telemetry    2:1 AVB and CHB, V rates 30's-40's - Personally Reviewed  ECG    No new EKGs - Personally Reviewed  Physical Exam   GEN: No acute distress.   Neck: No JVD Cardiac: RRR, bradycardic, no murmurs, rubs, or gallops.  Respiratory: Clear to auscultation bilaterally. GI: Soft, nontender, non-distended  MS: No edema; No deformity. Neuro:  Nonfocal  Psych: Normal affect   Labs    High Sensitivity Troponin:   Recent Labs  Lab 09/18/19 1146 09/18/19 1332  TROPONINIHS 26* 35*      Chemistry Recent Labs  Lab 09/18/19 1146 09/19/19 0620  NA 142 140  K 3.4* 3.1*  CL 108 106  CO2 21* 23  GLUCOSE 99 110*  BUN 18 15  CREATININE 1.19 1.15  CALCIUM 9.3 9.0  PROT 7.6  --   ALBUMIN 3.9  --   AST 28  --   ALT 16  --   ALKPHOS 49  --   BILITOT 0.6  --   GFRNONAA >60 >60  GFRAA >60 >60  ANIONGAP 13 11     Hematology Recent Labs  Lab 09/18/19 1146   WBC 6.6  RBC 4.56  HGB 13.9  HCT 41.2  MCV 90.4  MCH 30.5  MCHC 33.7  RDW 13.1  PLT 221    BNPNo results for input(s): BNP, PROBNP in the last 168 hours.   DDimer No results for input(s): DDIMER in the last 168 hours.   Radiology    DG Chest Port 1 View  Result Date: 09/18/2019 CLINICAL DATA:  Abnormal EKG. EXAM: PORTABLE CHEST 1 VIEW COMPARISON:  07/25/2008. FINDINGS: Mediastinum and hilar structures normal. Borderline cardiomegaly. No pulmonary venous congestion. No focal infiltrate. No pleural effusion or pneumothorax. Left costophrenic angle incompletely imaged. No acute bony abnormality. IMPRESSION: Borderline cardiomegaly. No pulmonary venous congestion. No acute pulmonary disease. Electronically Signed   By: 09/20/2019  Register   On: 09/18/2019 12:08      Cardiac Studies    C.MRI is pending  09/18/2019; TTE IMPRESSIONS  1. Left ventricular ejection fraction, by estimation, is 60 to 65%. The  left ventricle has normal function. The left ventricle has no regional  wall motion abnormalities.  There is mild left ventricular hypertrophy.  Left ventricular diastolic parameters  are indeterminate.  2. Right ventricular systolic function is normal. The right ventricular  size is normal. Tricuspid regurgitation signal is inadequate for assessing  PA pressure.  3. Left atrial size was mildly dilated.  4. Right atrial size was mildly dilated.  5. The mitral valve is normal in structure. Mild mitral valve  regurgitation. No evidence of mitral stenosis.  6. The aortic valve is tricuspid. Aortic valve regurgitation is not  visualized. No aortic stenosis is present.  7. Aortic dilatation noted. There is mild dilatation of the aortic root  measuring 39 mm.  8. The inferior vena cava is normal in size with greater than 50%  respiratory variability, suggesting right atrial pressure of 3 mmHg.  Patient Profile     64 y.o. male with HTN, gout, HTN, HLD arrived for his  elective colonoscopy and found to be bradycardic and in CHB (stable), referred to the ER.  Assessment & Plan    1. CHB     Asymptomatic     BP stable      Given lack of symptoms and small change of possibly vagally mediated with GI prep/recurrent diarrhea, planned to monitor over night  He remains in 2:1 and CHB V rates 30's No reversible causes  C.MRI this AM  I have discussed with the patient, ni improvement in his conduction and recommend proceeding with pacer. Discussed c.MRI and rational for this. Discussed the PPM implant procedure and potential risks and benefits He is agreeable to proceed  2. Hypokalemia     Was 3.4 (with mag 1.9) yesterday and given replacement     3.1 today, further ordered     Not causing his CHB   3. Mild abn HS Trop     No CP     No WMA     Likely demand 2/2 bradycardia  4. HTN     Resume home amlodipine post pacing     Monitor for now   For questions or updates, please contact [facility] Please consult [email or web address] for contact info under        Signed, Sheilah Pigeon, PA-C  09/19/2019, 9:29 AM    Complete heart block   Hypokalemia  HTN  Alcohol abuse    Pt has complete heart block--cause unknown.  cMRI pending to exclude sarcoid and other infiltrative disease.   If cMRI consistent with sarcoid, would plan ICD implant for primary prevention  The benefits and risks were reviewed including but not limited to death,  perforation, infection, lead dislodgement and device malfunction.  The patient understands agrees and is willing to proceed.

## 2019-09-19 NOTE — Progress Notes (Signed)
Reviewed cMRI with Dr CS-- no significant LGE  Will presume non sarcoid and proceed with pacemaker insertion

## 2019-09-20 ENCOUNTER — Encounter (HOSPITAL_COMMUNITY): Payer: Self-pay | Admitting: Internal Medicine

## 2019-09-20 ENCOUNTER — Inpatient Hospital Stay (HOSPITAL_COMMUNITY): Payer: 59

## 2019-09-20 MED ORDER — MUPIROCIN 2 % EX OINT
1.0000 "application " | TOPICAL_OINTMENT | Freq: Two times a day (BID) | CUTANEOUS | Status: DC
  Filled 2019-09-20: qty 22

## 2019-09-20 MED ORDER — ACETAMINOPHEN 325 MG PO TABS: 325.0000 mg | ORAL_TABLET | ORAL | Status: DC | PRN

## 2019-09-20 MED ORDER — MUPIROCIN 2 % EX OINT: 1.0000 "application " | TOPICAL_OINTMENT | Freq: Two times a day (BID) | CUTANEOUS | 0 refills | Status: AC

## 2019-09-20 NOTE — Discharge Instructions (Signed)
After Your Pacemaker  . You have a Medtronic Pacemaker  . Do not lift your arm above shoulder height for 1 week after your procedure. After 7 days, you may progress as below.     Wednesday September 26, 2019  Thursday September 27, 2019 Friday September 28, 2019 Saturday September 29, 2019   . Do not lift, push, pull, or carry anything over 5 pounds with the affected arm until 1 weeks, 10 lbs for 2 weeks, 15 lbs for 3 weeks, and 20 lbs for 4 weeks (PER DR Graciela Husbands)   . Do not drive until your wound check, or until instructed by your healthcare provider that you are safe to do so.   . Monitor your pacemaker site for redness, swelling, and drainage. Call the device clinic at [phone] if you experience these symptoms or fever/chills.  . Since your incision is closed with Dermabond/Surgical glue, you may shower 1 day after your pacemaker implant and wash around the site with soap and water. Avoid lotions, ointments, or perfumes over your incision until it is well-healed.  . You may use a hot tub or a pool AFTER your wound check appointment if the incision is completely closed.  . Your Pacemaker may be MRI compatible. We will discuss this at your first follow up/wound check. .   . Remote monitoring is used to monitor your pacemaker from home. This monitoring is scheduled every 91 days by our office. It allows [place] to keep an eye on the functioning of your device to ensure it is working properly. You will routinely see your Electrophysiologist annually (more often if necessary).    Pacemaker Implantation, Care After This sheet gives you information about how to care for yourself after your procedure. Your health care provider may also give you more specific instructions. If you have problems or questions, contact your health care provider. What can I expect after the procedure? After the procedure, it is common to have:  Mild pain.  Slight bruising.  Some swelling over the incision.  A  slight bump over the skin where the device was placed. Sometimes, it is possible to feel the device under the skin. This is normal.  You should received your Pacemaker ID card within 4-8 weeks. Follow these instructions at home: Medicines  Take over-the-counter and prescription medicines only as told by your health care provider.  If you were prescribed an antibiotic medicine, take it as told by your health care provider. Do not stop taking the antibiotic even if you start to feel better. Wound care     Do not remove the bandage on your chest until directed to do so by your health care provider.  After your bandage is removed, you may see pieces of tape called skin adhesive strips over the area where the cut was made (incision site). Let them fall off on their own.  Check the incision site every day to make sure it is not infected, bleeding, or starting to pull apart.  Do not use lotions or ointments near the incision site unless directed to do so.  Keep the incision area clean and dry for 7 days after the procedure or as directed by your health care provider. It takes several weeks for the incision site to completely heal.  Do not take baths, swim, or use a hot tub for 7-10 days or as otherwise directed by your health care provider. Activity  Do not drive or use heavy machinery while taking prescription pain medicine.  Do not drive for 24 hours if you were given a medicine to help you relax (sedative).  Check with your health care provider before you start to drive or play sports.  Avoid sudden jerking, pulling, or chopping movements that pull your upper arm far away from your body. Avoid these movements for at least 6 weeks or as long as told by your health care provider.  Do not lift your upper arm above your shoulders for at least 6 weeks or as long as told by your health care provider. This means no tennis, golf, or swimming.  You may go back to work when your health care  provider says it is okay. Pacemaker care  You may be shown how to transfer data from your pacemaker through the phone to your health care provider.  Always let all health care providers know about your pacemaker before you have any medical procedures or tests.  Wear a medical ID bracelet or necklace stating that you have a pacemaker. Carry a pacemaker ID card with you at all times.  Your pacemaker battery will last for 5-15 years. Routine checks by your health care provider will let the health care provider know when the battery is starting to run down. The pacemaker will need to be replaced when the battery starts to run down.  Do not use amateur Proofreader. Other electrical devices are safe to use, including power tools, lawn mowers, and speakers. If you are unsure of whether something is safe to use, ask your health care provider.  When using your cell phone, hold it to the ear opposite the pacemaker. Do not leave your cell phone in a pocket over the pacemaker.  Avoid places or objects that have a strong electric or magnetic field, including: ? Airport Actuary. When at the airport, let officials know that you have a pacemaker. ? Power plants. ? Large electrical generators. ? Radiofrequency transmission towers, such as cell phone and radio towers. General instructions  Weigh yourself every day. If you suddenly gain weight, fluid may be building up in your body.  Keep all follow-up visits as told by your health care provider. This is important. Contact a health care provider if:  You gain weight suddenly.  Your legs or feet swell.  It feels like your heart is fluttering or skipping beats (heart palpitations).  You have chills or a fever.  You have more redness, swelling, or pain around your incisions.  You have more fluid or blood coming from your incisions.  Your incisions feel warm to the touch.  You have pus or a bad smell coming  from your incisions. Get help right away if:  You have chest pain.  You have trouble breathing or are short of breath.  You become extremely tired.  You are light-headed or you faint. This information is not intended to replace advice given to you by your health care provider. Make sure you discuss any questions you have with your health care provider.

## 2019-09-20 NOTE — Discharge Summary (Signed)
ELECTROPHYSIOLOGY PROCEDURE DISCHARGE SUMMARY    Patient ID: Jose Cook,  MRN: [id number], DOB/AGE: 18-Sep-1955 64 y.o.  Admit date: 09/18/2019 Discharge date: 09/20/2019  Primary Care Physician: Ileana Ladd, MD  Primary Cardiologist: No primary care provider on file.  Electrophysiologist: New to Dr. Graciela Husbands  Primary Discharge Diagnosis:  Symptomatic bradycardia status post pacemaker implantation this admission  Secondary Discharge Diagnosis:  HTN Gout HLD  Allergies  Allergen Reactions  . Latex Dermatitis   Procedures This Admission:  1.  Implantation of a Medtronic dual chamber PPM on 09/19/2019 by Dr. Graciela Husbands. The patient received a Medtronic model number [id number] PPM with model number [id number] right atrial lead and 5076-58 right ventricular lead. There were no immediate post procedure complications. 2.  CXR on 09/20/19 demonstrated no pneumothorax status post device implantation.   Brief HPI: Jose Cook is a 64 y.o. male was admitted for colonoscopy and electrophysiology team asked to see for consideration of PPM implantation when patient presented with bradycardia and CHB.  Past medical history includes above.  The patient has had symptomatic bradycardia without reversible causes identified.  Risks, benefits, and alternatives to PPM implantation were reviewed with the patient who wished to proceed.   Hospital Course:  The patient was admitted and underwent implantation of a Medtronic dual chamber PPM with details as outlined above.  He was monitored on telemetry overnight which demonstrated appropriate pacing.  Left chest was without hematoma or ecchymosis.  The device was interrogated and found to be functioning normally.  CXR was obtained and demonstrated no pneumothorax status post device implantation.  Wound care, arm mobility, and restrictions were reviewed with the patient.  The patient was examined and considered stable for discharge to home.    Regarding blood  thinner therapy, pt is not on.   Physical Exam: Vitals:   09/19/19 1707 09/19/19 1737 09/19/19 2050 09/20/19 0500  BP: (!) 170/93 (!) 161/90 (!) 157/91 (!) 158/96  Pulse:   78 64  Resp:   17 18  Temp:   97.6 F (36.4 C) 98.7 F (37.1 C)  TempSrc:   Axillary Oral  SpO2:   94% 100%  Weight:    94 kg  Height:        GEN- The patient is well appearing, alert and oriented x 3 today.   HEENT: normocephalic, atraumatic; sclera clear, conjunctiva pink; hearing intact; oropharynx clear; neck supple, no JVP Lymph- no cervical lymphadenopathy Lungs- Clear to ausculation bilaterally, normal work of breathing.  No wheezes, rales, rhonchi Heart- Regular rate and rhythm, no murmurs, rubs or gallops, PMI not laterally displaced GI- soft, non-tender, non-distended, bowel sounds present, no hepatosplenomegaly Extremities- no clubbing, cyanosis, or edema; DP/PT/radial pulses 2+ bilaterally MS- no significant deformity or atrophy Skin- warm and dry, no rash or lesion, left chest without hematoma/ecchymosis Psych- euthymic mood, full affect Neuro- strength and sensation are intact  Labs:   Lab Results  Component Value Date   WBC 6.6 09/18/2019   HGB 13.9 09/18/2019   HCT 41.2 09/18/2019   MCV 90.4 09/18/2019   PLT 221 09/18/2019    Recent Labs  Lab 09/18/19 1146 09/18/19 1146 09/19/19 0620  NA 142   < > 140  K 3.4*   < > 3.1*  CL 108   < > 106  CO2 21*   < > 23  BUN 18   < > 15  CREATININE 1.19   < > 1.15  CALCIUM 9.3   < >  9.0  PROT 7.6  --   --   BILITOT 0.6  --   --   ALKPHOS 49  --   --   ALT 16  --   --   AST 28  --   --   GLUCOSE 99   < > 110*   < > = values in this interval not displayed.    Discharge Medications:  Allergies as of 09/20/2019      Reactions   Latex Dermatitis      Medication List    TAKE these medications   acetaminophen 325 MG tablet Commonly known as: TYLENOL Take 1-2 tablets (325-650 mg total) by mouth every 4 (four) hours as needed for mild  pain.   allopurinol 300 MG tablet Commonly known as: ZYLOPRIM Take 300 mg by mouth daily.   amLODipine 10 MG tablet Commonly known as: NORVASC Take 10 mg by mouth daily.   BIOFREEZE EX Apply 2 sprays topically 3 (three) times daily as needed (pain).   gemfibrozil 600 MG tablet Commonly known as: LOPID Take 600 mg by mouth daily.   mupirocin ointment 2 % Commonly known as: BACTROBAN Place 1 application into the nose 2 (two) times daily for 5 days. Start taking on: September 21, 2019   NAPROXEN PO Take 1-2 tablets by mouth 2 (two) times daily as needed (pain).   sildenafil 100 MG tablet Commonly known as: VIAGRA Take 50-100 mg by mouth daily as needed for erectile dysfunction.   terazosin 5 MG capsule Commonly known as: HYTRIN Take 5 mg by mouth at bedtime.       Disposition:    Follow-up Information    Duke Salvia, MD Follow up.   Specialty: Cardiology Why: 12/19/2019 @ 3;15PM Contact information: [address] [place] [address] [phone]        [facility] Follow up.   Specialty: Cardiology Why: 10/04/2019 @ 11:30AM, wound check visit Contact information: [address] [place] [address] [phone]              Duration of Discharge Encounter: Greater than 30 minutes including physician time.  Dustin Flock, PA-C  09/20/2019 9:15 AM

## 2019-10-04 ENCOUNTER — Other Ambulatory Visit: Payer: Self-pay

## 2019-10-04 ENCOUNTER — Ambulatory Visit (INDEPENDENT_AMBULATORY_CARE_PROVIDER_SITE_OTHER): Payer: 59 | Admitting: Emergency Medicine

## 2019-10-04 DIAGNOSIS — I442 Atrioventricular block, complete: Secondary | ICD-10-CM | POA: Diagnosis not present

## 2019-10-04 NOTE — Patient Instructions (Signed)
Work restrictions : No lifting, pulling or pushing more than 10 pounds with left arm until 11/01/19.

## 2019-10-16 LAB — CUP PACEART INCLINIC DEVICE CHECK
Date Time Interrogation Session: 20210916113059
Implantable Lead Implant Date: 20210901
Implantable Lead Implant Date: 20210901
Implantable Lead Location: 753859
Implantable Lead Location: 753860
Implantable Lead Model: 5076
Implantable Lead Model: 5076
Implantable Pulse Generator Implant Date: 20210901
Lead Channel Setting Pacing Amplitude: 3.5 V
Lead Channel Setting Pacing Amplitude: 3.5 V
Lead Channel Setting Pacing Pulse Width: 0.4 ms
Lead Channel Setting Sensing Sensitivity: 1.2 mV

## 2019-10-16 NOTE — Progress Notes (Signed)
Wound check appointment. Dermabond removed. Wound without redness or edema. Incision edges approximated, wound well healed. Normal device function. Thresholds, sensing, and impedances consistent with implant measurements. Device programmed at 3.5V/auto capture programmed on for extra safety margin until 3 month visit. Histogram distribution appropriate for patient and level of activity. No mode switches or high ventricular rates noted. Patient educated about wound care, arm mobility, lifting restrictions. ROV with Dr Graciela Husbands 12/28/19. Enrolled in remote follow-up and next remote scheduled for 12/20/19. No data available due to error in data collection.

## 2019-11-27 ENCOUNTER — Telehealth: Payer: Self-pay | Admitting: Gastroenterology

## 2019-11-27 NOTE — Telephone Encounter (Signed)
This patient came to see me for screening colonoscopy in late August, but the procedure was canceled because he was found to be in third-degree heart block and required transfer to hospital and pacemaker placement.  I recall from speaking with him and his wife that day that he had actually been having problems with diarrhea and perhaps some weight loss.  Please schedule this patient a next available clinic appointment with me to assess his symptoms, reassess cardiac status and decide how best to proceed.  - HD

## 2019-11-27 NOTE — Telephone Encounter (Signed)
Spoke with patient in regards to below. Pt states that he is not having any symptoms at the moment but would like to discuss timing of colonoscopy. Patient is scheduled for a follow up with Dr. Myrtie Neither on 12/11/19 at 8:20 AM. Pt had no other concerns at the end of the call.

## 2019-12-11 ENCOUNTER — Ambulatory Visit: Payer: 59 | Admitting: Gastroenterology

## 2019-12-20 ENCOUNTER — Ambulatory Visit (INDEPENDENT_AMBULATORY_CARE_PROVIDER_SITE_OTHER): Payer: 59

## 2019-12-20 DIAGNOSIS — I442 Atrioventricular block, complete: Secondary | ICD-10-CM | POA: Diagnosis not present

## 2019-12-20 LAB — CUP PACEART REMOTE DEVICE CHECK
Battery Remaining Longevity: 151 mo
Battery Voltage: 3.2 V
Brady Statistic AP VP Percent: 1.24 %
Brady Statistic AP VS Percent: 0 %
Brady Statistic AS VP Percent: 98.53 %
Brady Statistic AS VS Percent: 0.23 %
Brady Statistic RA Percent Paced: 1.38 %
Brady Statistic RV Percent Paced: 99.77 %
Date Time Interrogation Session: 20211202045031
Implantable Lead Implant Date: 20210901
Implantable Lead Implant Date: 20210901
Implantable Lead Location: 753859
Implantable Lead Location: 753860
Implantable Lead Model: 5076
Implantable Lead Model: 5076
Implantable Pulse Generator Implant Date: 20210901
Lead Channel Impedance Value: 304 Ohm
Lead Channel Impedance Value: 418 Ohm
Lead Channel Impedance Value: 475 Ohm
Lead Channel Impedance Value: 513 Ohm
Lead Channel Pacing Threshold Amplitude: 0.5 V
Lead Channel Pacing Threshold Amplitude: 0.5 V
Lead Channel Pacing Threshold Pulse Width: 0.4 ms
Lead Channel Pacing Threshold Pulse Width: 0.4 ms
Lead Channel Sensing Intrinsic Amplitude: 2 mV
Lead Channel Sensing Intrinsic Amplitude: 2 mV
Lead Channel Sensing Intrinsic Amplitude: 5 mV
Lead Channel Sensing Intrinsic Amplitude: 5 mV
Lead Channel Setting Pacing Amplitude: 1.5 V
Lead Channel Setting Pacing Amplitude: 2 V
Lead Channel Setting Pacing Pulse Width: 0.4 ms
Lead Channel Setting Sensing Sensitivity: 1.2 mV

## 2019-12-27 DIAGNOSIS — Z950 Presence of cardiac pacemaker: Secondary | ICD-10-CM | POA: Insufficient documentation

## 2019-12-28 ENCOUNTER — Other Ambulatory Visit: Payer: Self-pay

## 2019-12-28 ENCOUNTER — Ambulatory Visit: Payer: 59 | Admitting: Internal Medicine

## 2019-12-28 ENCOUNTER — Encounter: Payer: Self-pay | Admitting: Internal Medicine

## 2019-12-28 DIAGNOSIS — I442 Atrioventricular block, complete: Secondary | ICD-10-CM | POA: Diagnosis not present

## 2019-12-28 DIAGNOSIS — Z950 Presence of cardiac pacemaker: Secondary | ICD-10-CM

## 2019-12-28 LAB — CUP PACEART INCLINIC DEVICE CHECK
Battery Remaining Longevity: 156 mo
Battery Voltage: 3.2 V
Brady Statistic AP VP Percent: 1.14 %
Brady Statistic AP VS Percent: 0 %
Brady Statistic AS VP Percent: 98.65 %
Brady Statistic AS VS Percent: 0.21 %
Brady Statistic RA Percent Paced: 1.27 %
Brady Statistic RV Percent Paced: 99.79 %
Date Time Interrogation Session: 20211210170819
Implantable Lead Implant Date: 20210901
Implantable Lead Implant Date: 20210901
Implantable Lead Location: 753859
Implantable Lead Location: 753860
Implantable Lead Model: 5076
Implantable Lead Model: 5076
Implantable Pulse Generator Implant Date: 20210901
Lead Channel Impedance Value: 342 Ohm
Lead Channel Impedance Value: 456 Ohm
Lead Channel Impedance Value: 589 Ohm
Lead Channel Impedance Value: 627 Ohm
Lead Channel Pacing Threshold Amplitude: 0.5 V
Lead Channel Pacing Threshold Amplitude: 0.5 V
Lead Channel Pacing Threshold Pulse Width: 0.4 ms
Lead Channel Pacing Threshold Pulse Width: 0.4 ms
Lead Channel Sensing Intrinsic Amplitude: 0.625 mV
Lead Channel Sensing Intrinsic Amplitude: 1.5 mV
Lead Channel Sensing Intrinsic Amplitude: 5 mV
Lead Channel Sensing Intrinsic Amplitude: 7.625 mV
Lead Channel Setting Pacing Amplitude: 1.5 V
Lead Channel Setting Pacing Amplitude: 2 V
Lead Channel Setting Pacing Pulse Width: 0.4 ms
Lead Channel Setting Sensing Sensitivity: 1.2 mV

## 2019-12-28 NOTE — Progress Notes (Signed)
Remote pacemaker transmission.   

## 2019-12-28 NOTE — Patient Instructions (Signed)
Medication Instructions:  Your physician recommends that you continue on your current medications as directed. Please refer to the Current Medication list given to you today.  *If you need a refill on your cardiac medications before your next appointment, please call your pharmacy*   Lab Work: None ordered.  If you have labs (blood work) drawn today and your tests are completely normal, you will receive your results only by: Marland Kitchen MyChart Message (if you have MyChart) OR . A paper copy in the mail If you have any lab test that is abnormal or we need to change your treatment, we will call you to review the results.   Testing/Procedures: Dr Graciela Husbands is recommending a PET scan at [facility].  Someone will call you to schedule within the next few weeks.   Follow-Up: At [facility], you and your health needs are our priority.  As part of our continuing mission to provide you with exceptional heart care, we have created designated Provider Care Teams.  These Care Teams include your primary Cardiologist (physician) and Advanced Practice Providers (APPs -  Physician Assistants and Nurse Practitioners) who all work together to provide you with the care you need, when you need it.  We recommend signing up for the patient portal called "MyChart".  Sign up information is provided on this After Visit Summary.  MyChart is used to connect with patients for Virtual Visits (Telemedicine).  Patients are able to view lab/test results, encounter notes, upcoming appointments, etc.  Non-urgent messages can be sent to your provider as well.   To learn more about what you can do with MyChart, go to [email or web address].    Your next appointment:   9 month(s)  The format for your next appointment:   In Person  Provider:   Sherryl Manges, MD

## 2019-12-28 NOTE — Progress Notes (Signed)
Patient Care Team: Ileana Ladd, MD as PCP - General (Family Medicine)   HPI  Jose Cook is a 64 y.o. male seen in follow-up for pacemaker implanted 9/21 for complete heart block.  The patient denies chest pain, shortness of breath, nocturnal dyspnea, orthopnea or peripheral edema.  There have been no palpitations, lightheadedness or syncope.     DATE TEST EF   8/21 Echo   60-65 %   9/21 cMRI*  65 % No LGE            Records and Results Reviewed   Past Medical History:  Diagnosis Date  . Allergy    seasonal allergies  . Gout   . HTN (hypertension)    on meds  . Hyperlipidemia    on meds    Past Surgical History:  Procedure Laterality Date  . COLONOSCOPY  2010   normal-recall 25yrs  . COLONOSCOPY  09/18/2019  . KNEE SURGERY Left    x 2 sx  . PACEMAKER IMPLANT N/A 09/19/2019   Procedure: PACEMAKER IMPLANT;  Surgeon: Duke Salvia, MD;  Location: [facility] INVASIVE CV LAB;  Service: Cardiovascular;  Laterality: N/A;  . ROTATOR CUFF REPAIR Left     Current Meds  Medication Sig  . acetaminophen (TYLENOL) 325 MG tablet Take 1-2 tablets (325-650 mg total) by mouth every 4 (four) hours as needed for mild pain.  Marland Kitchen allopurinol (ZYLOPRIM) 300 MG tablet Take 300 mg by mouth daily.   Marland Kitchen amLODipine (NORVASC) 10 MG tablet Take 10 mg by mouth daily.  Marland Kitchen gemfibrozil (LOPID) 600 MG tablet Take 600 mg by mouth daily.   Marland Kitchen losartan (COZAAR) 50 MG tablet Take 50 mg by mouth daily.  . Menthol, Topical Analgesic, (BIOFREEZE EX) Apply 2 sprays topically 3 (three) times daily as needed (pain).  Marland Kitchen NAPROXEN PO Take 1-2 tablets by mouth 2 (two) times daily as needed (pain).  . sildenafil (VIAGRA) 100 MG tablet Take 50-100 mg by mouth daily as needed for erectile dysfunction.    Current Facility-Administered Medications for the 12/28/19 encounter (Office Visit) with Duke Salvia, MD  Medication  . 0.9 %  sodium chloride infusion    Allergies  Allergen Reactions  . Latex  Dermatitis      Review of Systems negative except from HPI and PMH  Physical Exam BP 132/88   Pulse 76   Ht 6\' 4"  (1.93 m)   Wt 224 lb (101.6 kg)   BMI 27.27 kg/m  Well developed and well nourished in no acute distress HENT normal E scleral and icterus clear Neck Supple JVP flat; carotids brisk and full Clear to ausculation Device pocket well healed; without hematoma or erythema.  There is no tethering  Regular rate and rhythm, no murmurs gallops or rub Soft with active bowel sounds No clubbing cyanosis  Edema Alert and oriented, grossly normal motor and sensory function Skin Warm and Dry  ECG sinus with P-synchronous/ AV  pacing   CrCl cannot be calculated (Patient's most recent lab result is older than the maximum 21 days allowed.).   Assessment and  Plan  Complete heart block  VT nonsustained  Pacemaker Medtronic    The concern is the cause of the heart block in this relatively young man especially in light of the now identified presence of nonsustained ventricular tachycardia  I have discussed with him the possibility of cardiac sarcoid.  With his MRI negative there remains a subgroup of patients with positive  PET.  Hence, have recommended that we undertake PET scanning and he understands that if it were positive that we would recommend immunosuppressive therapy.  If this study is equivocal, I would review with Dr. Excell Seltzer at [facility]       Current medicines are reviewed at length with the patient today .  The patient does not  have concerns regarding medicines.

## 2020-01-02 NOTE — Addendum Note (Signed)
Addended by: Dareen Piano on: 01/02/2020 10:46 AM   Modules accepted: Orders

## 2020-03-17 ENCOUNTER — Telehealth: Payer: Self-pay | Admitting: Internal Medicine

## 2020-03-17 NOTE — Telephone Encounter (Signed)
Patient's wife states Dr. Graciela Husbands placed an order for the patient to have a positron emission tomography scan and it has been scheduled for 03/22/20. However, she states the patient becomes anxious prior to tests and she would like to know if Dr. Graciela Husbands can prescribe a temporary medication to assist with his nerves and anxiety. Please advise.

## 2020-03-20 ENCOUNTER — Ambulatory Visit (INDEPENDENT_AMBULATORY_CARE_PROVIDER_SITE_OTHER): Payer: 59

## 2020-03-20 ENCOUNTER — Other Ambulatory Visit: Payer: Self-pay | Admitting: Student

## 2020-03-20 DIAGNOSIS — I442 Atrioventricular block, complete: Secondary | ICD-10-CM

## 2020-03-20 DIAGNOSIS — F419 Anxiety disorder, unspecified: Secondary | ICD-10-CM

## 2020-03-20 MED ORDER — DIAZEPAM 5 MG PO TABS: 5.0000 mg | ORAL_TABLET | ORAL | 0 refills | Status: DC | PRN

## 2020-03-20 MED ORDER — DIAZEPAM 5 MG PO TABS: ORAL_TABLET | ORAL | 0 refills | Status: DC

## 2020-03-20 NOTE — Progress Notes (Signed)
Per Dr. Graciela Husbands  1 time dose valium for pre-procedural anxiety (procedure at [facility])

## 2020-03-20 NOTE — Telephone Encounter (Signed)
Spoke with pt's wife, DPR and advised per Dr Graciela Husbands will prescribe Valium 5mg  - 1 tablet by mouth one hour prior to pt's testing scheduled for 03/22/2020.  Pt's wife verbalizes understanding and agrees with current plan

## 2020-03-20 NOTE — Telephone Encounter (Signed)
Dr Graciela Husbands notified pt has decided to not take Valium 5mg  for procedure at [facility] on 03/22/2020.

## 2020-03-20 NOTE — Telephone Encounter (Signed)
Pt wife called in and stated pt has decided to go without it.  She stated there was not need in calling it In.

## 2020-03-24 LAB — CUP PACEART REMOTE DEVICE CHECK
Battery Remaining Longevity: 150 mo
Battery Voltage: 3.17 V
Brady Statistic AP VP Percent: 1.26 %
Brady Statistic AP VS Percent: 0.01 %
Brady Statistic AS VP Percent: 98.41 %
Brady Statistic AS VS Percent: 0.32 %
Brady Statistic RA Percent Paced: 1.44 %
Brady Statistic RV Percent Paced: 99.67 %
Date Time Interrogation Session: 20220303032034
Implantable Lead Implant Date: 20210901
Implantable Lead Implant Date: 20210901
Implantable Lead Location: 753859
Implantable Lead Location: 753860
Implantable Lead Model: 5076
Implantable Lead Model: 5076
Implantable Pulse Generator Implant Date: 20210901
Lead Channel Impedance Value: 323 Ohm
Lead Channel Impedance Value: 418 Ohm
Lead Channel Impedance Value: 494 Ohm
Lead Channel Impedance Value: 551 Ohm
Lead Channel Pacing Threshold Amplitude: 0.375 V
Lead Channel Pacing Threshold Amplitude: 0.625 V
Lead Channel Pacing Threshold Pulse Width: 0.4 ms
Lead Channel Pacing Threshold Pulse Width: 0.4 ms
Lead Channel Sensing Intrinsic Amplitude: 0.375 mV
Lead Channel Sensing Intrinsic Amplitude: 0.375 mV
Lead Channel Sensing Intrinsic Amplitude: 5.75 mV
Lead Channel Sensing Intrinsic Amplitude: 5.75 mV
Lead Channel Setting Pacing Amplitude: 1.5 V
Lead Channel Setting Pacing Amplitude: 2 V
Lead Channel Setting Pacing Pulse Width: 0.4 ms
Lead Channel Setting Sensing Sensitivity: 1.2 mV

## 2020-03-31 NOTE — Progress Notes (Signed)
Remote pacemaker transmission.   

## 2020-04-02 ENCOUNTER — Telehealth: Payer: Self-pay | Admitting: Internal Medicine

## 2020-04-02 NOTE — Telephone Encounter (Signed)
Patients had his PET scan on 03/21/20 at [facility] he would like to see if Dr. Graciela Husbands has the results. Please advise

## 2020-04-02 NOTE — Telephone Encounter (Signed)
RN returned call to patient regarding PET scan results. Patient is wanting to hear a report from Dr. Graciela Husbands. RN advised Dr. Graciela Husbands and his nurse are out of the office, but I would send a message to the both of them regarding the results. Patient verbalized understanding and thanked Charity fundraiser for calling.   RN routed call to MD and nurse.

## 2020-04-03 NOTE — Telephone Encounter (Signed)
Please Inform Patient PET scan was normal  this is good news,   we will interpret taht the heart block is just something but not related to sarcoid or other processes of infiltration or inflammation    Thanks

## 2020-04-03 NOTE — Telephone Encounter (Signed)
Spoke with pt and advised per Dr Graciela Husbands his PET scan is normal and will interpret the heart block as not related to Sarcoid.  This is good news!.  Pt verbalizes understanding and thanked Charity fundraiser for the call.

## 2020-06-19 ENCOUNTER — Ambulatory Visit (INDEPENDENT_AMBULATORY_CARE_PROVIDER_SITE_OTHER): Payer: 59

## 2020-06-19 DIAGNOSIS — I442 Atrioventricular block, complete: Secondary | ICD-10-CM | POA: Diagnosis not present

## 2020-06-19 LAB — CUP PACEART REMOTE DEVICE CHECK
Battery Remaining Longevity: 143 mo
Battery Voltage: 3.12 V
Brady Statistic AP VP Percent: 1.6 %
Brady Statistic AP VS Percent: 0.01 %
Brady Statistic AS VP Percent: 97.45 %
Brady Statistic AS VS Percent: 0.95 %
Brady Statistic RA Percent Paced: 2.04 %
Brady Statistic RV Percent Paced: 99.05 %
Date Time Interrogation Session: 20220602005528
Implantable Lead Implant Date: 20210901
Implantable Lead Implant Date: 20210901
Implantable Lead Location: 753859
Implantable Lead Location: 753860
Implantable Lead Model: 5076
Implantable Lead Model: 5076
Implantable Pulse Generator Implant Date: 20210901
Lead Channel Impedance Value: 304 Ohm
Lead Channel Impedance Value: 380 Ohm
Lead Channel Impedance Value: 418 Ohm
Lead Channel Impedance Value: 456 Ohm
Lead Channel Pacing Threshold Amplitude: 0.5 V
Lead Channel Pacing Threshold Amplitude: 0.625 V
Lead Channel Pacing Threshold Pulse Width: 0.4 ms
Lead Channel Pacing Threshold Pulse Width: 0.4 ms
Lead Channel Sensing Intrinsic Amplitude: 0.375 mV
Lead Channel Sensing Intrinsic Amplitude: 0.375 mV
Lead Channel Sensing Intrinsic Amplitude: 6.125 mV
Lead Channel Sensing Intrinsic Amplitude: 6.125 mV
Lead Channel Setting Pacing Amplitude: 1.5 V
Lead Channel Setting Pacing Amplitude: 2 V
Lead Channel Setting Pacing Pulse Width: 0.4 ms
Lead Channel Setting Sensing Sensitivity: 1.2 mV

## 2020-07-14 NOTE — Progress Notes (Signed)
Remote pacemaker transmission.   

## 2020-09-18 ENCOUNTER — Ambulatory Visit (INDEPENDENT_AMBULATORY_CARE_PROVIDER_SITE_OTHER): Payer: 59

## 2020-09-18 DIAGNOSIS — I442 Atrioventricular block, complete: Secondary | ICD-10-CM

## 2020-09-23 LAB — CUP PACEART REMOTE DEVICE CHECK
Battery Remaining Longevity: 139 mo
Battery Voltage: 3.06 V
Brady Statistic AP VP Percent: 1.09 %
Brady Statistic AP VS Percent: 0.01 %
Brady Statistic AS VP Percent: 98.46 %
Brady Statistic AS VS Percent: 0.44 %
Brady Statistic RA Percent Paced: 1.15 %
Brady Statistic RV Percent Paced: 99.55 %
Date Time Interrogation Session: 20220901022810
Implantable Lead Implant Date: 20210901
Implantable Lead Implant Date: 20210901
Implantable Lead Location: 753859
Implantable Lead Location: 753860
Implantable Lead Model: 5076
Implantable Lead Model: 5076
Implantable Pulse Generator Implant Date: 20210901
Lead Channel Impedance Value: 304 Ohm
Lead Channel Impedance Value: 380 Ohm
Lead Channel Impedance Value: 399 Ohm
Lead Channel Impedance Value: 437 Ohm
Lead Channel Pacing Threshold Amplitude: 0.375 V
Lead Channel Pacing Threshold Amplitude: 0.5 V
Lead Channel Pacing Threshold Pulse Width: 0.4 ms
Lead Channel Pacing Threshold Pulse Width: 0.4 ms
Lead Channel Sensing Intrinsic Amplitude: 0.625 mV
Lead Channel Sensing Intrinsic Amplitude: 0.625 mV
Lead Channel Sensing Intrinsic Amplitude: 6.375 mV
Lead Channel Sensing Intrinsic Amplitude: 6.375 mV
Lead Channel Setting Pacing Amplitude: 1.5 V
Lead Channel Setting Pacing Amplitude: 2 V
Lead Channel Setting Pacing Pulse Width: 0.4 ms
Lead Channel Setting Sensing Sensitivity: 1.2 mV

## 2020-09-25 ENCOUNTER — Encounter: Payer: Self-pay | Admitting: Internal Medicine

## 2020-09-25 ENCOUNTER — Ambulatory Visit: Payer: 59 | Admitting: Internal Medicine

## 2020-09-25 ENCOUNTER — Other Ambulatory Visit: Payer: Self-pay

## 2020-09-25 VITALS — BP 124/64 | HR 82 | Ht 76.0 in

## 2020-09-25 DIAGNOSIS — I442 Atrioventricular block, complete: Secondary | ICD-10-CM | POA: Diagnosis not present

## 2020-09-25 DIAGNOSIS — Z950 Presence of cardiac pacemaker: Secondary | ICD-10-CM

## 2020-09-25 NOTE — Progress Notes (Signed)
Patient Care Team: Ileana Ladd, MD as PCP - General (Family Medicine)   HPI  Jose Cook is a 65 y.o. male seen in follow-up for Medtronic pacemaker implanted 9/21 for complete heart block.  Today, the patient denies chest pain, shortness of breath, nocturnal dyspnea, orthopnea  or peripheral edema.  There have been no palpitations, lightheadedness or syncope  BP well controlled.   DATE TEST EF   8/21 Echo   60-65 %   9/21 cMRI*  65 % No LGE   3/22 PET  Normal     Records and Results Reviewed   Past Medical History:  Diagnosis Date   Allergy    seasonal allergies   Gout    HTN (hypertension)    on meds   Hyperlipidemia    on meds    Past Surgical History:  Procedure Laterality Date   COLONOSCOPY  2010   normal-recall 47yrs   COLONOSCOPY  09/18/2019   KNEE SURGERY Left    x 2 sx   PACEMAKER IMPLANT N/A 09/19/2019   Procedure: PACEMAKER IMPLANT;  Surgeon: Duke Salvia, MD;  Location: [facility] INVASIVE CV LAB;  Service: Cardiovascular;  Laterality: N/A;   ROTATOR CUFF REPAIR Left     Current Meds  Medication Sig   allopurinol (ZYLOPRIM) 300 MG tablet Take 300 mg by mouth daily.    amLODipine (NORVASC) 10 MG tablet Take 10 mg by mouth daily.   diazepam (VALIUM) 5 MG tablet Take 1 tablet (5 mg total) by mouth as needed for anxiety (pre-procedure).   losartan (COZAAR) 50 MG tablet Take 50 mg by mouth daily.   Menthol, Topical Analgesic, (BIOFREEZE EX) Apply 2 sprays topically 3 (three) times daily as needed (pain).   sildenafil (VIAGRA) 100 MG tablet Take 50-100 mg by mouth daily as needed for erectile dysfunction.    Current Facility-Administered Medications for the 09/25/20 encounter (Office Visit) with Duke Salvia, MD  Medication   0.9 %  sodium chloride infusion    Allergies  Allergen Reactions   Latex Dermatitis      Review of Systems negative except from HPI and PMH  Physical Exam BP 124/64   Pulse 82   Ht 6\' 4"  (1.93 m)   SpO2 96%    BMI 27.27 kg/m  Well developed and well nourished in no acute distress HENT normal Neck supple with JVP-flat Clear Device pocket well healed; without hematoma or erythema.  There is no tethering  Regular rate and rhythm, no  murmur Abd-soft with active BS No Clubbing cyanosis  edema Skin-warm and dry A & Oriented  Grossly normal sensory and motor function   ECG : 12/21 : sinus with P-synchronous/ AV  pacing  09/22 : Sinus with P synchronous pacing  CrCl cannot be calculated (Patient's most recent lab result is older than the maximum 21 days allowed.).   Assessment and  Plan  Complete heart block  VT nonsustained  Pacemaker Medtronic   PET scan was negative.  No explanation for his complete heart block and nonsustained VT.  Continue to monitor.  Device function is normal.  Functional status remains normal.  Blood pressure well controlled.  Continue him amlodipine 10 losartan 50     Current medicines are reviewed at length with the patient today .  The patient does not  have concerns regarding medicines.   I,Jose Cook,acting as a scribe for 10/22, MD.,have documented all relevant documentation on the behalf of Jose Manges,  MD,as directed by  Jose Manges, MD while in the presence of Jose Manges, MD.  I, Jose Manges, MD, have reviewed all documentation for this visit. The documentation on 09/25/20 for the exam, diagnosis, procedures, and orders are all accurate and complete.

## 2020-09-25 NOTE — Patient Instructions (Signed)
Medication Instructions:  Your physician recommends that you continue on your current medications as directed. Please refer to the Current Medication list given to you today.  *If you need a refill on your cardiac medications before your next appointment, please call your pharmacy*   Lab Work: None ordered.  If you have labs (blood work) drawn today and your tests are completely normal, you will receive your results only by: MyChart Message (if you have MyChart) OR A paper copy in the mail If you have any lab test that is abnormal or we need to change your treatment, we will call you to review the results.   Testing/Procedures: None ordered.    Follow-Up: At [facility], you and your health needs are our priority.  As part of our continuing mission to provide you with exceptional heart care, we have created designated Provider Care Teams.  These Care Teams include your primary Cardiologist (physician) and Advanced Practice Providers (APPs -  Physician Assistants and Nurse Practitioners) who all work together to provide you with the care you need, when you need it.  We recommend signing up for the patient portal called "MyChart".  Sign up information is provided on this After Visit Summary.  MyChart is used to connect with patients for Virtual Visits (Telemedicine).  Patients are able to view lab/test results, encounter notes, upcoming appointments, etc.  Non-urgent messages can be sent to your provider as well.   To learn more about what you can do with MyChart, go to [email or web address].    Your next appointment:   12 month(s)  The format for your next appointment:   In Person  Provider:   You will see one of the following Advanced Practice Providers on your designated Care Team:   Renee Ursuy, PA-C Michael "Andy" Tillery, PA-C     

## 2020-09-30 NOTE — Progress Notes (Signed)
Remote pacemaker transmission.   

## 2020-11-27 ENCOUNTER — Ambulatory Visit: Payer: 59 | Admitting: Podiatry

## 2020-11-27 ENCOUNTER — Other Ambulatory Visit: Payer: Self-pay

## 2020-11-27 ENCOUNTER — Ambulatory Visit (INDEPENDENT_AMBULATORY_CARE_PROVIDER_SITE_OTHER): Payer: 59

## 2020-11-27 DIAGNOSIS — M19071 Primary osteoarthritis, right ankle and foot: Secondary | ICD-10-CM | POA: Diagnosis not present

## 2020-11-27 DIAGNOSIS — M2041 Other hammer toe(s) (acquired), right foot: Secondary | ICD-10-CM

## 2020-11-27 DIAGNOSIS — M722 Plantar fascial fibromatosis: Secondary | ICD-10-CM

## 2020-11-27 DIAGNOSIS — M21611 Bunion of right foot: Secondary | ICD-10-CM

## 2020-11-27 DIAGNOSIS — M2042 Other hammer toe(s) (acquired), left foot: Secondary | ICD-10-CM

## 2020-11-30 NOTE — Progress Notes (Signed)
Subjective:   Patient ID: Jose Cook, male   DOB: 65 y.o.   MRN: [id number]   HPI 65 year old male presents the office with concerns of foot pain with right side worse than left.  He states that mostly this is occurs when walking.  He has not seen significant swelling but wife thought there was a bit of swelling.  He has not had any recent injury or trauma.  No recent treatment.  He gets pain to the right foot on the bunion but also along the left heel which radiates to the foot.  No other concerns.   Review of Systems  All other systems reviewed and are negative.  Past Medical History:  Diagnosis Date   Allergy    seasonal allergies   Gout    HTN (hypertension)    on meds   Hyperlipidemia    on meds    Past Surgical History:  Procedure Laterality Date   COLONOSCOPY  2010   normal-recall 67yrs   COLONOSCOPY  09/18/2019   KNEE SURGERY Left    x 2 sx   PACEMAKER IMPLANT N/A 09/19/2019   Procedure: PACEMAKER IMPLANT;  Surgeon: Duke Salvia, MD;  Location: [facility] INVASIVE CV LAB;  Service: Cardiovascular;  Laterality: N/A;   ROTATOR CUFF REPAIR Left      Current Outpatient Medications:    acetaminophen (TYLENOL) 325 MG tablet, Take 1-2 tablets (325-650 mg total) by mouth every 4 (four) hours as needed for mild pain. (Patient not taking: Reported on 09/25/2020), Disp: , Rfl:    allopurinol (ZYLOPRIM) 300 MG tablet, Take 300 mg by mouth daily. , Disp: , Rfl:    amLODipine (NORVASC) 10 MG tablet, Take 10 mg by mouth daily., Disp: , Rfl:    diazepam (VALIUM) 5 MG tablet, Take 1 tablet (5 mg total) by mouth as needed for anxiety (pre-procedure)., Disp: 1 tablet, Rfl: 0   gemfibrozil (LOPID) 600 MG tablet, Take 600 mg by mouth daily.  (Patient not taking: Reported on 09/25/2020), Disp: , Rfl:    losartan (COZAAR) 50 MG tablet, Take 50 mg by mouth daily., Disp: , Rfl:    Menthol, Topical Analgesic, (BIOFREEZE EX), Apply 2 sprays topically 3 (three) times daily as needed (pain)., Disp: ,  Rfl:    NAPROXEN PO, Take 1-2 tablets by mouth 2 (two) times daily as needed (pain). (Patient not taking: Reported on 09/25/2020), Disp: , Rfl:    sildenafil (VIAGRA) 100 MG tablet, Take 50-100 mg by mouth daily as needed for erectile dysfunction. , Disp: , Rfl:   Current Facility-Administered Medications:    0.9 %  sodium chloride infusion, 500 mL, Intravenous, Once, Danis, Starr Lake III, MD  Allergies  Allergen Reactions   Latex Dermatitis         Objective:  Physical Exam  General: AAO x3, NAD  Dermatological: Skin is warm, dry and supple bilateral. There are no open sores, no preulcerative lesions, no rash or signs of infection present.  Vascular: Dorsalis Pedis artery and Posterior Tibial artery pedal pulses are 2/4 bilateral with immedate capillary fill time.  There is no pain with calf compression, swelling, warmth, erythema.   Neruologic: Grossly intact via light touch bilateral.  Negative Tinel sign.  Musculoskeletal: Decreased medial arch height upon weightbearing.  The majority tenderness is along the plantar aspect of the heel the insertion of the plantar fascia and along the arch of the foot as well plantarly.  There is no specific area of pinpoint tenderness.  Hammertoes  are present bilaterally as well as bunion deformity right side worse than left.  Prominent metatarsal heads plantarly.  MMT 5/5.   Gait: Unassisted, Nonantalgic.       Assessment:   65 year old male with plantar fasciitis, metatarsalgia     Plan:  -Treatment options discussed including all alternatives, risks, and complications -Etiology of symptoms were discussed -X-rays were obtained and reviewed with the patient.  Decreased calcaneal inclination angle.  Bunion present right side worse than left.  Hammertoes are present.  Arthritic changes present to the talonavicular joint on the right side. -We discussed multiple treatment options.  I do recommend orthotics to help with support but also  metatarsal pads to help offload the metatarsal heads.  He was molded for orthotics today.  We discussed stretching, icing daily.  Discussed shoes with good arch support as well.  (Patient aware of orthotic cost if not covered by insurance).    Vivi Barrack DPM

## 2020-12-18 ENCOUNTER — Ambulatory Visit (INDEPENDENT_AMBULATORY_CARE_PROVIDER_SITE_OTHER): Payer: 59

## 2020-12-18 DIAGNOSIS — I442 Atrioventricular block, complete: Secondary | ICD-10-CM | POA: Diagnosis not present

## 2020-12-18 LAB — CUP PACEART REMOTE DEVICE CHECK
Battery Remaining Longevity: 137 mo
Battery Voltage: 3.04 V
Brady Statistic AP VP Percent: 0.53 %
Brady Statistic AP VS Percent: 0 %
Brady Statistic AS VP Percent: 99.14 %
Brady Statistic AS VS Percent: 0.32 %
Brady Statistic RA Percent Paced: 0.54 %
Brady Statistic RV Percent Paced: 99.67 %
Date Time Interrogation Session: 20221201021407
Implantable Lead Implant Date: 20210901
Implantable Lead Implant Date: 20210901
Implantable Lead Location: 753859
Implantable Lead Location: 753860
Implantable Lead Model: 5076
Implantable Lead Model: 5076
Implantable Pulse Generator Implant Date: 20210901
Lead Channel Impedance Value: 304 Ohm
Lead Channel Impedance Value: 380 Ohm
Lead Channel Impedance Value: 399 Ohm
Lead Channel Impedance Value: 456 Ohm
Lead Channel Pacing Threshold Amplitude: 0.5 V
Lead Channel Pacing Threshold Amplitude: 0.5 V
Lead Channel Pacing Threshold Pulse Width: 0.4 ms
Lead Channel Pacing Threshold Pulse Width: 0.4 ms
Lead Channel Sensing Intrinsic Amplitude: 1.25 mV
Lead Channel Sensing Intrinsic Amplitude: 1.25 mV
Lead Channel Sensing Intrinsic Amplitude: 7 mV
Lead Channel Sensing Intrinsic Amplitude: 7 mV
Lead Channel Setting Pacing Amplitude: 1.5 V
Lead Channel Setting Pacing Amplitude: 2 V
Lead Channel Setting Pacing Pulse Width: 0.4 ms
Lead Channel Setting Sensing Sensitivity: 1.2 mV

## 2020-12-19 ENCOUNTER — Telehealth: Payer: Self-pay | Admitting: Podiatry

## 2020-12-19 NOTE — Telephone Encounter (Signed)
Orthotics in.. lvm for pt to call to schedule an appt to pick them up. °

## 2020-12-23 ENCOUNTER — Ambulatory Visit: Payer: 59

## 2020-12-23 ENCOUNTER — Other Ambulatory Visit: Payer: Self-pay

## 2020-12-23 DIAGNOSIS — M722 Plantar fascial fibromatosis: Secondary | ICD-10-CM

## 2020-12-23 DIAGNOSIS — M21611 Bunion of right foot: Secondary | ICD-10-CM

## 2020-12-23 DIAGNOSIS — M19071 Primary osteoarthritis, right ankle and foot: Secondary | ICD-10-CM

## 2020-12-23 DIAGNOSIS — M2042 Other hammer toe(s) (acquired), left foot: Secondary | ICD-10-CM

## 2020-12-23 DIAGNOSIS — M2041 Other hammer toe(s) (acquired), right foot: Secondary | ICD-10-CM

## 2020-12-23 NOTE — Progress Notes (Signed)
SITUATION: Reason for Visit: Fitting and Delivery of Custom Fabricated Foot Orthoses Patient Report: Patient reports comfort and is satisfied with device.  OBJECTIVE DATA: Patient History / Diagnosis:  No change in pathology Provided Device:  Custom functional foot orthoses  GOAL OF ORTHOSIS - Improve gait - Decrease energy expenditure - Improve Balance - Provide Triplanar stability of foot complex - Facilitate motion  ACTIONS PERFORMED Patient was fit with foot orthoses trimmed to shoe last. Patient tolerated fittign procedure. Device was modified as follows to better fit patient: - Toe plate was trimmed to shoe last  Patient was provided with verbal and written instruction and demonstration regarding donning, doffing, wear, care, proper fit, function, purpose, cleaning, and use of the orthosis and in all related precautions and risks and benefits regarding the orthosis.  Patient was also provided with verbal instruction regarding how to report any failures or malfunctions of the orthosis and necessary follow up care. Patient was also instructed to contact our office regarding any change in status that may affect the function of the orthosis.  Patient demonstrated independence with proper donning, doffing, and fit and verbalized understanding of all instructions.  PLAN: Patient is to follow up in one week or as necessary (PRN). All questions were answered and concerns addressed. Plan of care was discussed with and agreed upon by the patient.  

## 2020-12-30 NOTE — Progress Notes (Signed)
Remote pacemaker transmission.   

## 2021-03-19 ENCOUNTER — Ambulatory Visit (INDEPENDENT_AMBULATORY_CARE_PROVIDER_SITE_OTHER): Payer: 59

## 2021-03-19 DIAGNOSIS — I442 Atrioventricular block, complete: Secondary | ICD-10-CM | POA: Diagnosis not present

## 2021-03-19 LAB — CUP PACEART REMOTE DEVICE CHECK
Battery Remaining Longevity: 135 mo
Battery Voltage: 3.03 V
Brady Statistic AP VP Percent: 0.38 %
Brady Statistic AP VS Percent: 0 %
Brady Statistic AS VP Percent: 99.25 %
Brady Statistic AS VS Percent: 0.37 %
Brady Statistic RA Percent Paced: 0.4 %
Brady Statistic RV Percent Paced: 99.63 %
Date Time Interrogation Session: 20230302021736
Implantable Lead Implant Date: 20210901
Implantable Lead Implant Date: 20210901
Implantable Lead Location: 753859
Implantable Lead Location: 753860
Implantable Lead Model: 5076
Implantable Lead Model: 5076
Implantable Pulse Generator Implant Date: 20210901
Lead Channel Impedance Value: 323 Ohm
Lead Channel Impedance Value: 399 Ohm
Lead Channel Impedance Value: 437 Ohm
Lead Channel Impedance Value: 475 Ohm
Lead Channel Pacing Threshold Amplitude: 0.5 V
Lead Channel Pacing Threshold Amplitude: 0.5 V
Lead Channel Pacing Threshold Pulse Width: 0.4 ms
Lead Channel Pacing Threshold Pulse Width: 0.4 ms
Lead Channel Sensing Intrinsic Amplitude: 1.75 mV
Lead Channel Sensing Intrinsic Amplitude: 1.75 mV
Lead Channel Sensing Intrinsic Amplitude: 5.875 mV
Lead Channel Sensing Intrinsic Amplitude: 5.875 mV
Lead Channel Setting Pacing Amplitude: 1.5 V
Lead Channel Setting Pacing Amplitude: 2 V
Lead Channel Setting Pacing Pulse Width: 0.4 ms
Lead Channel Setting Sensing Sensitivity: 1.2 mV

## 2021-03-26 NOTE — Progress Notes (Signed)
Remote pacemaker transmission.   

## 2021-06-05 ENCOUNTER — Ambulatory Visit: Payer: 59 | Admitting: Podiatry

## 2021-06-05 ENCOUNTER — Ambulatory Visit (INDEPENDENT_AMBULATORY_CARE_PROVIDER_SITE_OTHER): Payer: 59

## 2021-06-05 ENCOUNTER — Encounter: Payer: Self-pay | Admitting: Podiatry

## 2021-06-05 DIAGNOSIS — M722 Plantar fascial fibromatosis: Secondary | ICD-10-CM

## 2021-06-05 MED ORDER — TRIAMCINOLONE ACETONIDE 10 MG/ML IJ SUSP
10.0000 mg | Freq: Once | INTRAMUSCULAR | Status: AC
  Administered 2021-06-05: 10 mg

## 2021-06-05 NOTE — Progress Notes (Signed)
Subjective:   Patient ID: Jose Cook, male   DOB: 66 y.o.   MRN: [id number]   HPI Patient presents with a knot in the left arch and concerned that its been there now for just only 2 weeks as far as he knows and its been painful.  It measures 7 mm x 1 cm   ROS      Objective:  Physical Exam  Neurovascular status intact with inflammation pain of the knot on the left plantar arch that may be a cyst or possible plantar fibroma but with fasciitis symptoms     Assessment:  Difficult to make complete determination between fibroma and fasciitis-like symptoms     Plan:  H&P x-ray reviewed condition discussed measured again at 7 mm x 1 cm and went ahead today did sterile prep and injected the fascia 3 mg Kenalog 5 mg Xylocaine to try to shrink the area advised on heat therapy watching this and the consideration for excision if it grows in size becomes painful or changes color  X-rays indicate no signs of calcification or bony changes

## 2021-06-18 ENCOUNTER — Ambulatory Visit (INDEPENDENT_AMBULATORY_CARE_PROVIDER_SITE_OTHER): Payer: 59

## 2021-06-18 DIAGNOSIS — I442 Atrioventricular block, complete: Secondary | ICD-10-CM

## 2021-06-18 LAB — CUP PACEART REMOTE DEVICE CHECK
Battery Remaining Longevity: 132 mo
Battery Voltage: 3.02 V
Brady Statistic AP VP Percent: 0.62 %
Brady Statistic AP VS Percent: 0.01 %
Brady Statistic AS VP Percent: 98.82 %
Brady Statistic AS VS Percent: 0.56 %
Brady Statistic RA Percent Paced: 0.63 %
Brady Statistic RV Percent Paced: 99.44 %
Date Time Interrogation Session: 20230531195448
Implantable Lead Implant Date: 20210901
Implantable Lead Implant Date: 20210901
Implantable Lead Location: 753859
Implantable Lead Location: 753860
Implantable Lead Model: 5076
Implantable Lead Model: 5076
Implantable Pulse Generator Implant Date: 20210901
Lead Channel Impedance Value: 323 Ohm
Lead Channel Impedance Value: 380 Ohm
Lead Channel Impedance Value: 418 Ohm
Lead Channel Impedance Value: 475 Ohm
Lead Channel Pacing Threshold Amplitude: 0.5 V
Lead Channel Pacing Threshold Amplitude: 0.5 V
Lead Channel Pacing Threshold Pulse Width: 0.4 ms
Lead Channel Pacing Threshold Pulse Width: 0.4 ms
Lead Channel Sensing Intrinsic Amplitude: 1.625 mV
Lead Channel Sensing Intrinsic Amplitude: 1.625 mV
Lead Channel Sensing Intrinsic Amplitude: 7.5 mV
Lead Channel Sensing Intrinsic Amplitude: 7.5 mV
Lead Channel Setting Pacing Amplitude: 1.5 V
Lead Channel Setting Pacing Amplitude: 2 V
Lead Channel Setting Pacing Pulse Width: 0.4 ms
Lead Channel Setting Sensing Sensitivity: 1.2 mV

## 2021-06-26 NOTE — Progress Notes (Signed)
Remote pacemaker transmission.   

## 2021-09-03 ENCOUNTER — Encounter (HOSPITAL_COMMUNITY): Payer: Self-pay | Admitting: *Deleted

## 2021-09-03 ENCOUNTER — Ambulatory Visit (INDEPENDENT_AMBULATORY_CARE_PROVIDER_SITE_OTHER): Payer: 59

## 2021-09-03 ENCOUNTER — Ambulatory Visit (HOSPITAL_COMMUNITY)
Admission: EM | Admit: 2021-09-03 | Discharge: 2021-09-03 | Disposition: A | Discharge status: Home / Self Care | Payer: 59 | Attending: Family Medicine | Admitting: Family Medicine

## 2021-09-03 DIAGNOSIS — Z23 Encounter for immunization: Secondary | ICD-10-CM | POA: Diagnosis not present

## 2021-09-03 DIAGNOSIS — S9032XA Contusion of left foot, initial encounter: Secondary | ICD-10-CM

## 2021-09-03 DIAGNOSIS — M79672 Pain in left foot: Secondary | ICD-10-CM | POA: Diagnosis not present

## 2021-09-03 DIAGNOSIS — S90812A Abrasion, left foot, initial encounter: Secondary | ICD-10-CM

## 2021-09-03 HISTORY — DX: Presence of cardiac pacemaker: Z95.0

## 2021-09-03 MED ORDER — TRAMADOL HCL 50 MG PO TABS: 50.0000 mg | ORAL_TABLET | Freq: Four times a day (QID) | ORAL | 0 refills | Status: DC | PRN

## 2021-09-03 MED ORDER — TETANUS-DIPHTH-ACELL PERTUSSIS 5-2.5-18.5 LF-MCG/0.5 IM SUSY
0.5000 mL | PREFILLED_SYRINGE | Freq: Once | INTRAMUSCULAR | Status: AC
  Administered 2021-09-03: 0.5 mL via INTRAMUSCULAR

## 2021-09-03 MED ORDER — IBUPROFEN 600 MG PO TABS: 600.0000 mg | ORAL_TABLET | Freq: Four times a day (QID) | ORAL | 0 refills | Status: DC | PRN

## 2021-09-03 MED ORDER — TETANUS-DIPHTH-ACELL PERTUSSIS 5-2.5-18.5 LF-MCG/0.5 IM SUSY
PREFILLED_SYRINGE | INTRAMUSCULAR | Status: AC
  Filled 2021-09-03: qty 0.5

## 2021-09-03 MED ORDER — KETOROLAC TROMETHAMINE 30 MG/ML IJ SOLN
30.0000 mg | Freq: Once | INTRAMUSCULAR | Status: AC
  Administered 2021-09-03: 30 mg via INTRAMUSCULAR

## 2021-09-03 MED ORDER — KETOROLAC TROMETHAMINE 30 MG/ML IJ SOLN
INTRAMUSCULAR | Status: AC
  Filled 2021-09-03: qty 1

## 2021-09-03 NOTE — ED Provider Notes (Signed)
[facility]    CSN: [id number] Arrival date & time: 09/03/21  1641      History   Chief Complaint Chief Complaint  Patient presents with   Foot Injury    HPI Jose Cook is a 66 y.o. male.    Foot Injury  Here for left foot pain.  Yesterday, he was changing a tire, the jack failed and let the vehicle drop onto his left foot.  He has pain around his distal foot on the medial side.  Past Medical History:  Diagnosis Date   Allergy    seasonal allergies   Gout    HTN (hypertension)    on meds   Hyperlipidemia    on meds   Pacemaker     Patient Active Problem List   Diagnosis Date Noted   Pacemaker 12/27/2019   Complete heart block (HCC) 09/18/2019   Gout 07/17/2012    Past Surgical History:  Procedure Laterality Date   COLONOSCOPY  2010   normal-recall 34yrs   COLONOSCOPY  09/18/2019   KNEE SURGERY Left    x 2 sx   PACEMAKER IMPLANT N/A 09/19/2019   Procedure: PACEMAKER IMPLANT;  Surgeon: Duke Salvia, MD;  Location: [facility] INVASIVE CV LAB;  Service: Cardiovascular;  Laterality: N/A;   ROTATOR CUFF REPAIR Left        Home Medications    Prior to Admission medications   Medication Sig Start Date End Date Taking? Authorizing Provider  allopurinol (ZYLOPRIM) 300 MG tablet Take 300 mg by mouth daily.  04/07/18  Yes [provider]  amLODipine (NORVASC) 10 MG tablet Take 10 mg by mouth daily. 08/16/19  Yes [provider]  ibuprofen (ADVIL) 600 MG tablet Take 1 tablet (600 mg total) by mouth every 6 (six) hours as needed. 09/03/21  Yes Zenia Resides, MD  losartan (COZAAR) 50 MG tablet Take 50 mg by mouth daily.   Yes [provider]  Omega-3 Fatty Acids (FISH OIL PO) Take by mouth.   Yes [provider]  sildenafil (VIAGRA) 100 MG tablet Take 50-100 mg by mouth daily as needed for erectile dysfunction.  09/02/19  Yes [provider]  traMADol (ULTRAM) 50 MG tablet Take 1 tablet (50 mg total) by mouth  every 6 (six) hours as needed (pain). 09/03/21  Yes Trinton Prewitt, Janace Aris, MD  UNKNOWN TO PATIENT "A cholesterol med"   Yes [provider]  acetaminophen (TYLENOL) 325 MG tablet Take 1-2 tablets (325-650 mg total) by mouth every 4 (four) hours as needed for mild pain. Patient not taking: Reported on 09/25/2020 09/20/19   Graciella Freer, PA-C  diazepam (VALIUM) 5 MG tablet Take 1 tablet (5 mg total) by mouth as needed for anxiety (pre-procedure). 03/20/20   Graciella Freer, PA-C  gemfibrozil (LOPID) 600 MG tablet Take 600 mg by mouth daily.  Patient not taking: Reported on 09/25/2020 08/25/19   [provider]  Menthol, Topical Analgesic, (BIOFREEZE EX) Apply 2 sprays topically 3 (three) times daily as needed (pain).    [provider]    Family History Family History  Problem Relation Age of Onset   Lymphoma Father 35   Colon polyps Neg Hx    Colon cancer Neg Hx    Esophageal cancer Neg Hx    Rectal cancer Neg Hx    Stomach cancer Neg Hx     Social History Social History   Tobacco Use   Smoking status: Former    Packs/day:  1.00    Years: 20.00    Total pack years: 20.00    Types: Cigarettes    Quit date: 09/24/2019    Years since quitting: 1.9   Smokeless tobacco: Never  Vaping Use   Vaping Use: Never used  Substance Use Topics   Alcohol use: Yes    Comment: 6 pack every couple days   Drug use: No     Allergies   Latex   Review of Systems Review of Systems   Physical Exam Triage Vital Signs ED Triage Vitals  Enc Vitals Group     BP 09/03/21 1713 117/74     Pulse Rate 09/03/21 1713 84     Resp 09/03/21 1713 16     Temp 09/03/21 1713 98.5 F (36.9 C)     Temp Source 09/03/21 1713 Oral     SpO2 09/03/21 1713 98 %     Weight --      Height --      Head Circumference --      Peak Flow --      Pain Score 09/03/21 1714 9     Pain Loc --      Pain Edu? --      Excl. in GC? --    No data found.  Updated Vital Signs BP 117/74    Pulse 84   Temp 98.5 F (36.9 C) (Oral)   Resp 16   SpO2 98%   Visual Acuity Right Eye Distance:   Left Eye Distance:   Bilateral Distance:    Right Eye Near:   Left Eye Near:    Bilateral Near:     Physical Exam Vitals reviewed.  Constitutional:      General: He is not in acute distress.    Appearance: He is not ill-appearing, toxic-appearing or diaphoretic.  Musculoskeletal:     Comments: He has swelling and tenderness around his forefoot near the toes.  This area overlies the first and second distal metatarsals.  There is an abrasion on the dorsum of the foot.  Pulses intact.  The toes are warm and capillary refill is normal  Skin:    Capillary Refill: Capillary refill takes less than 2 seconds.     Coloration: Skin is not jaundiced or pale.  Neurological:     General: No focal deficit present.     Mental Status: He is alert and oriented to person, place, and time.  Psychiatric:        Behavior: Behavior normal.      UC Treatments / Results  Labs (all labs ordered are listed, but only abnormal results are displayed) Labs Reviewed - No data to display  EKG   Radiology DG Foot Complete Left  Result Date: 09/03/2021 CLINICAL DATA:  Crush injury. EXAM: LEFT FOOT - COMPLETE 3+ VIEW COMPARISON:  None Available. FINDINGS: There is no evidence of fracture or dislocation. There are mild degenerative changes of the first metatarsophalangeal joint. Soft tissues are unremarkable. IMPRESSION: No acute fracture or dislocation. Electronically Signed   By: Darliss Cheney M.D.   On: 09/03/2021 17:49    Procedures Procedures (including critical care time)  Medications Ordered in UC Medications  Tdap (BOOSTRIX) injection 0.5 mL (has no administration in time range)  ketorolac (TORADOL) 30 MG/ML injection 30 mg (has no administration in time range)    Initial Impression / Assessment and Plan / UC Course  I have reviewed the triage vital signs and the nursing  notes.  Pertinent labs &  imaging results that were available during my care of the patient were reviewed by me and considered in my medical decision making (see chart for details).     His x-rays are negative for fracture.  Regard to put him in a postop shoe and provide pain relief.  He is unsure when his last tetanus was and it was probably over 5 years ago, so we will update that Final Clinical Impressions(s) / UC Diagnoses   Final diagnoses:  Contusion of left foot, initial encounter  Abrasion, left foot, initial encounter   Discharge Instructions   None    ED Prescriptions     Medication Sig Dispense Auth. Provider   ibuprofen (ADVIL) 600 MG tablet Take 1 tablet (600 mg total) by mouth every 6 (six) hours as needed. 21 tablet Tyvon Eggenberger, Gwenlyn Perking, MD   traMADol (ULTRAM) 50 MG tablet Take 1 tablet (50 mg total) by mouth every 6 (six) hours as needed (pain). 12 tablet Saachi Zale, Gwenlyn Perking, MD      I have reviewed the PDMP during this encounter.   Barrett Henle, MD 09/03/21 [phone]

## 2021-09-03 NOTE — ED Triage Notes (Signed)
Pt reports changing a tire on a vehicle yesterday; states the jack kicked back causing the vehicle to drop onto his left foot. Pt ambulating with significant limp, but declines wheelchair. LLE CMS intact. Swelling and ecchymosis noted to left foot.

## 2021-09-17 ENCOUNTER — Ambulatory Visit (INDEPENDENT_AMBULATORY_CARE_PROVIDER_SITE_OTHER): Payer: 59

## 2021-09-17 DIAGNOSIS — I442 Atrioventricular block, complete: Secondary | ICD-10-CM

## 2021-09-21 LAB — CUP PACEART REMOTE DEVICE CHECK
Battery Remaining Longevity: 128 mo
Battery Voltage: 3.02 V
Brady Statistic AP VP Percent: 0.96 %
Brady Statistic AP VS Percent: 0 %
Brady Statistic AS VP Percent: 98.37 %
Brady Statistic AS VS Percent: 0.67 %
Brady Statistic RA Percent Paced: 0.99 %
Brady Statistic RV Percent Paced: 99.32 %
Date Time Interrogation Session: 20230830222125
Implantable Lead Implant Date: 20210901
Implantable Lead Implant Date: 20210901
Implantable Lead Location: 753859
Implantable Lead Location: 753860
Implantable Lead Model: 5076
Implantable Lead Model: 5076
Implantable Pulse Generator Implant Date: 20210901
Lead Channel Impedance Value: 323 Ohm
Lead Channel Impedance Value: 361 Ohm
Lead Channel Impedance Value: 418 Ohm
Lead Channel Impedance Value: 456 Ohm
Lead Channel Pacing Threshold Amplitude: 0.5 V
Lead Channel Pacing Threshold Amplitude: 0.5 V
Lead Channel Pacing Threshold Pulse Width: 0.4 ms
Lead Channel Pacing Threshold Pulse Width: 0.4 ms
Lead Channel Sensing Intrinsic Amplitude: 1.125 mV
Lead Channel Sensing Intrinsic Amplitude: 1.125 mV
Lead Channel Sensing Intrinsic Amplitude: 5.625 mV
Lead Channel Sensing Intrinsic Amplitude: 5.625 mV
Lead Channel Setting Pacing Amplitude: 1.5 V
Lead Channel Setting Pacing Amplitude: 2 V
Lead Channel Setting Pacing Pulse Width: 0.4 ms
Lead Channel Setting Sensing Sensitivity: 1.2 mV

## 2021-10-08 NOTE — Progress Notes (Signed)
Remote pacemaker transmission.   

## 2021-12-17 ENCOUNTER — Ambulatory Visit (INDEPENDENT_AMBULATORY_CARE_PROVIDER_SITE_OTHER): Payer: 59

## 2021-12-17 DIAGNOSIS — I442 Atrioventricular block, complete: Secondary | ICD-10-CM

## 2021-12-18 LAB — CUP PACEART REMOTE DEVICE CHECK
Battery Remaining Longevity: 127 mo
Battery Voltage: 3.01 V
Brady Statistic AP VP Percent: 1.13 %
Brady Statistic AP VS Percent: 0.01 %
Brady Statistic AS VP Percent: 98.33 %
Brady Statistic AS VS Percent: 0.52 %
Brady Statistic RA Percent Paced: 1.16 %
Brady Statistic RV Percent Paced: 99.47 %
Date Time Interrogation Session: 20231130054835
Implantable Lead Connection Status: 753985
Implantable Lead Connection Status: 753985
Implantable Lead Implant Date: 20210901
Implantable Lead Implant Date: 20210901
Implantable Lead Location: 753859
Implantable Lead Location: 753860
Implantable Lead Model: 5076
Implantable Lead Model: 5076
Implantable Pulse Generator Implant Date: 20210901
Lead Channel Impedance Value: 342 Ohm
Lead Channel Impedance Value: 380 Ohm
Lead Channel Impedance Value: 456 Ohm
Lead Channel Impedance Value: 494 Ohm
Lead Channel Pacing Threshold Amplitude: 0.5 V
Lead Channel Pacing Threshold Amplitude: 0.5 V
Lead Channel Pacing Threshold Pulse Width: 0.4 ms
Lead Channel Pacing Threshold Pulse Width: 0.4 ms
Lead Channel Sensing Intrinsic Amplitude: 1.25 mV
Lead Channel Sensing Intrinsic Amplitude: 1.25 mV
Lead Channel Sensing Intrinsic Amplitude: 5.375 mV
Lead Channel Sensing Intrinsic Amplitude: 5.375 mV
Lead Channel Setting Pacing Amplitude: 1.5 V
Lead Channel Setting Pacing Amplitude: 2 V
Lead Channel Setting Pacing Pulse Width: 0.4 ms
Lead Channel Setting Sensing Sensitivity: 1.2 mV
Zone Setting Status: 755011

## 2022-01-06 NOTE — Progress Notes (Signed)
Remote pacemaker transmission.   

## 2022-01-15 ENCOUNTER — Ambulatory Visit: Payer: 59 | Admitting: Internal Medicine

## 2022-01-15 DIAGNOSIS — I442 Atrioventricular block, complete: Secondary | ICD-10-CM

## 2022-01-15 DIAGNOSIS — Z950 Presence of cardiac pacemaker: Secondary | ICD-10-CM

## 2022-02-01 ENCOUNTER — Encounter: Payer: 59 | Admitting: Internal Medicine

## 2022-02-17 ENCOUNTER — Encounter: Payer: 59 | Admitting: Internal Medicine

## 2022-02-17 DIAGNOSIS — Z950 Presence of cardiac pacemaker: Secondary | ICD-10-CM

## 2022-02-17 DIAGNOSIS — I442 Atrioventricular block, complete: Secondary | ICD-10-CM

## 2022-03-18 ENCOUNTER — Ambulatory Visit: Payer: 59

## 2022-03-18 DIAGNOSIS — I442 Atrioventricular block, complete: Secondary | ICD-10-CM

## 2022-03-18 LAB — CUP PACEART REMOTE DEVICE CHECK
Battery Remaining Longevity: 121 mo
Battery Voltage: 3.01 V
Brady Statistic AP VP Percent: 0.29 %
Brady Statistic AP VS Percent: 0 %
Brady Statistic AS VP Percent: 99.38 %
Brady Statistic AS VS Percent: 0.33 %
Brady Statistic RA Percent Paced: 0.31 %
Brady Statistic RV Percent Paced: 99.67 %
Date Time Interrogation Session: 20240229033900
Implantable Lead Connection Status: 753985
Implantable Lead Connection Status: 753985
Implantable Lead Implant Date: 20210901
Implantable Lead Implant Date: 20210901
Implantable Lead Location: 753859
Implantable Lead Location: 753860
Implantable Lead Model: 5076
Implantable Lead Model: 5076
Implantable Pulse Generator Implant Date: 20210901
Lead Channel Impedance Value: 304 Ohm
Lead Channel Impedance Value: 361 Ohm
Lead Channel Impedance Value: 399 Ohm
Lead Channel Impedance Value: 437 Ohm
Lead Channel Pacing Threshold Amplitude: 0.5 V
Lead Channel Pacing Threshold Amplitude: 0.5 V
Lead Channel Pacing Threshold Pulse Width: 0.4 ms
Lead Channel Pacing Threshold Pulse Width: 0.4 ms
Lead Channel Sensing Intrinsic Amplitude: 0.25 mV
Lead Channel Sensing Intrinsic Amplitude: 0.25 mV
Lead Channel Sensing Intrinsic Amplitude: 5.125 mV
Lead Channel Sensing Intrinsic Amplitude: 5.125 mV
Lead Channel Setting Pacing Amplitude: 1.5 V
Lead Channel Setting Pacing Amplitude: 2 V
Lead Channel Setting Pacing Pulse Width: 0.4 ms
Lead Channel Setting Sensing Sensitivity: 1.2 mV
Zone Setting Status: 755011

## 2022-04-19 NOTE — Progress Notes (Signed)
Remote pacemaker transmission.   

## 2022-05-03 ENCOUNTER — Ambulatory Visit: Payer: 59 | Admitting: Podiatry

## 2022-05-03 ENCOUNTER — Encounter: Payer: Self-pay | Admitting: Podiatry

## 2022-05-03 ENCOUNTER — Ambulatory Visit (INDEPENDENT_AMBULATORY_CARE_PROVIDER_SITE_OTHER): Payer: 59

## 2022-05-03 DIAGNOSIS — M2042 Other hammer toe(s) (acquired), left foot: Secondary | ICD-10-CM | POA: Diagnosis not present

## 2022-05-03 DIAGNOSIS — M722 Plantar fascial fibromatosis: Secondary | ICD-10-CM

## 2022-05-03 DIAGNOSIS — M216X9 Other acquired deformities of unspecified foot: Secondary | ICD-10-CM | POA: Diagnosis not present

## 2022-05-03 NOTE — Progress Notes (Signed)
Subjective:   Patient ID: [name], male   DOB: 67 y.o.   MRN: [id number]   HPI Patient presents with wife stating that his left foot is really bothering him and it is gradually getting worse and it feels like it is enlarging and the medicine only helps short-term.  States overall he is doing well does not smoke tries to be active   ROS      Objective:  Physical Exam  Ocular status intact with patient found to have Noss in the plantar arch left measuring 1.2 cm x 1 cm painful when pressed and has severe digital deformities digits 234 left over right with pain in the second and third metatarsal phalangeal joint getting worse over the last 6 months.  States has tried wider shoes she has tried cushioning's for the areas and has tried inserts without relief     Assessment:  Probability for plantar fibroma left with hammertoe deformity second third fourth digits left over right rigid contracture at the MPJ with inflammation pain of the second third metatarsal phalangeal joint left     Plan:  H&P reviewed at great length and I do think given the end crease in size increase in pain it would be best to remove the plantar fibroma and he wants this done as does his wife.  Also due to forefoot pain and also he is getting some nerve pain I did go ahead today discussed digital fusion digits 234 left foot and shortening osteotomy second and third metatarsal phalangeal joint left to take pressure off the joints and also he is going to start on gabapentin.  He wants this surgery and is tried conservative treatments and at this point I allowed him to read consent form going over all possible complications associated with surgery and the fact that he will need to be nonweightbearing several weeks of total recovery.  Will take approximately 6 months.  Patient scheduled for outpatient surgery after all questions answered he signed consent form and I then went ahead and dispensed air fracture walker properly  fitted to the lower leg that I want him to get used to prior to surgery.  X-ray dated today indicate severe contracture lesser digits left over right with no indications of Planter calcification

## 2022-05-04 ENCOUNTER — Telehealth: Payer: Self-pay | Admitting: Podiatry

## 2022-05-04 ENCOUNTER — Telehealth: Payer: Self-pay | Admitting: Urology

## 2022-05-04 NOTE — Telephone Encounter (Signed)
DOS - 05/18/22  METATARSAL OSTEOTOMY 2,3 LEFT --- 16109 HAMMERTOE REPAIR 2-4 LEFT --- 60454 PLANTAR FIBROMA LEFT --- 28062  [facility] EFFECTIVE DATE - 01/18/22  DEDUCTIBLE - $500.00 W/ $500.00 REMAINING OOP - $5,000.00 W/  $4,889.91 REMAINING COINSURANCE - 20%  PER [employer] WEBSITE FOR CPT CODES 09811, [id number] AND 28062 HAVE BEEN APPROVED, AUTH # [id number], GOOD FROM 05/18/22 - 05/18/22.

## 2022-05-04 NOTE — Telephone Encounter (Signed)
Pt stated that the pharmacy has not received the Rx for Gabapentin. Please advise

## 2022-05-05 ENCOUNTER — Other Ambulatory Visit: Payer: Self-pay | Admitting: Podiatry

## 2022-05-05 MED ORDER — GABAPENTIN 300 MG PO CAPS: 300.0000 mg | ORAL_CAPSULE | Freq: Three times a day (TID) | ORAL | 3 refills | Status: AC

## 2022-05-05 NOTE — Telephone Encounter (Signed)
I sent it in 

## 2022-05-11 ENCOUNTER — Ambulatory Visit: Payer: 59 | Attending: Internal Medicine | Admitting: Internal Medicine

## 2022-05-11 ENCOUNTER — Encounter: Payer: Self-pay | Admitting: Internal Medicine

## 2022-05-11 VITALS — BP 118/84 | HR 107 | Ht 76.0 in | Wt 214.0 lb

## 2022-05-11 DIAGNOSIS — I4729 Other ventricular tachycardia: Secondary | ICD-10-CM | POA: Diagnosis not present

## 2022-05-11 DIAGNOSIS — I442 Atrioventricular block, complete: Secondary | ICD-10-CM

## 2022-05-11 DIAGNOSIS — Z79899 Other long term (current) drug therapy: Secondary | ICD-10-CM | POA: Diagnosis not present

## 2022-05-11 DIAGNOSIS — Z950 Presence of cardiac pacemaker: Secondary | ICD-10-CM

## 2022-05-11 NOTE — Progress Notes (Signed)
      Patient Care Team: Jackelyn Poling, DO as PCP - General (Family Medicine)   HPI  Jose Cook is a 67 y.o. male seen in follow-up for Medtronic pacemaker implanted 9/21 for complete heart block.  Found to have VT NS  The patient denies chest pai, shortness of breat, nocturnal dyspnea, orthopnea or peripheral edema.  There have been no palpitations, lightheadedness or syncope.  Complains of weight loss unintentional but has recovered the loss-- no night sweats  Has foot surgery pending .   DATE TEST EF   8/21 Echo   60-65 %   9/21 cMRI*  65 % No LGE   3/22 PET  Normal     Date Cr K Hgb  8/21   13.9  4/24 1.29 3.7             Records and Results Reviewed   Past Medical History:  Diagnosis Date   Allergy    seasonal allergies   Gout    HTN (hypertension)    on meds   Hyperlipidemia    on meds   Pacemaker     Past Surgical History:  Procedure Laterality Date   COLONOSCOPY  2010   normal-recall 68yrs   COLONOSCOPY  09/18/2019   KNEE SURGERY Left    x 2 sx   PACEMAKER IMPLANT N/A 09/19/2019   Procedure: PACEMAKER IMPLANT;  Surgeon: Duke Salvia, MD;  Location: [facility] INVASIVE CV LAB;  Service: Cardiovascular;  Laterality: N/A;   ROTATOR CUFF REPAIR Left     Current Meds  Medication Sig   acetaminophen (TYLENOL) 325 MG tablet Take 1-2 tablets (325-650 mg total) by mouth every 4 (four) hours as needed for mild pain.    Allergies  Allergen Reactions   Latex Dermatitis      Review of Systems negative except from HPI and PMH  Physical Exam BP 118/84   Pulse (!) 107   Ht  (1.93 m)   Wt 214 lb (97.1 kg)   SpO2 97%   BMI 26.05 kg/m  Well developed and nourished in no acute distress HENT normal Neck supple with JVP-  flat  Clear Device pocket well healed; without hematoma or erythema.  There is no tethering  Regular rate and rhythm, no murmurs or gallops Abd-soft with active BS No Clubbing cyanosis edema Skin-warm and dry A & Oriented   Grossly normal sensory and motor function  ECG sinus at 107 Intervals 18/08/37  CrCl cannot be calculated (Patient's most recent lab result is older than the maximum 21 days allowed.).   Assessment and  Plan  Complete heart block  VT nonsustained  Pacemaker Medtronic   Sinus tachycardia  Preoperative assessment  Patient is stable functional tolerance.  Intermittent weight loss is concerning however, the fact that he has recovered his weight loss is encouraging.  Sinus tachycardia seems to be new reviewing his histograms where 90% plus of his heartbeats were less than 100 bpm and has persisted greater than 100 bpm for his entire visit here.  Will check a TSH and a CBC.  Preoperative risk assessment he should be at low risk; we will check metabolic parameters as outlined above if they are okay his surgical risk should be acceptable

## 2022-05-11 NOTE — Patient Instructions (Signed)
Medication Instructions:  Your physician recommends that you continue on your current medications as directed. Please refer to the Current Medication list given to you today.  *If you need a refill on your cardiac medications before your next appointment, please call your pharmacy*   Lab Work: CBC and TSH If you have labs (blood work) drawn today and your tests are completely normal, you will receive your results only by: MyChart Message (if you have MyChart) OR A paper copy in the mail If you have any lab test that is abnormal or we need to change your treatment, we will call you to review the results.   Testing/Procedures: None ordered.    Follow-Up: At [facility], you and your health needs are our priority.  As part of our continuing mission to provide you with exceptional heart care, we have created designated Provider Care Teams.  These Care Teams include your primary Cardiologist (physician) and Advanced Practice Providers (APPs -  Physician Assistants and Nurse Practitioners) who all work together to provide you with the care you need, when you need it.  We recommend signing up for the patient portal called "MyChart".  Sign up information is provided on this After Visit Summary.  MyChart is used to connect with patients for Virtual Visits (Telemedicine).  Patients are able to view lab/test results, encounter notes, upcoming appointments, etc.  Non-urgent messages can be sent to your provider as well.   To learn more about what you can do with MyChart, go to [email or web address].    Your next appointment:   12 months with Dr Graciela Husbands

## 2022-05-12 LAB — CBC
Hematocrit: 35.6 % — ABNORMAL LOW (ref 37.5–51.0)
Hemoglobin: 12.2 g/dL — ABNORMAL LOW (ref 13.0–17.7)
MCH: 30.3 pg (ref 26.6–33.0)
MCHC: 34.3 g/dL (ref 31.5–35.7)
MCV: 88 fL (ref 79–97)
Platelets: 285 x10E3/uL (ref 150–450)
RBC: 4.03 x10E6/uL — ABNORMAL LOW (ref 4.14–5.80)
RDW: 13.1 % (ref 11.6–15.4)
WBC: 5.9 x10E3/uL (ref 3.4–10.8)

## 2022-05-12 LAB — TSH: TSH: 2.99 u[IU]/mL (ref 0.450–4.500)

## 2022-05-13 ENCOUNTER — Telehealth: Payer: Self-pay | Admitting: Internal Medicine

## 2022-05-13 NOTE — Telephone Encounter (Signed)
   Pre-operative Risk Assessment    Patient Name: Jose Cook  DOB: 08-14-1955 MRN: [id number]      Request for Surgical Clearance    Procedure:   Hammer toe repair and metatarsal osteotomy  Date of Surgery:  Clearance 05/18/22                                 Surgeon:  Dr. Cristie Hem Surgeon's Group or Practice Name:  [facility]  Phone number:  [phone] Fax number:  [phone]   Type of Clearance Requested:   - Medical    Type of Anesthesia:  General    Additional requests/questions:   States they already received clearance for the procedure from Dr. Graciela Husbands with last OV. Anesthesiologist  is needing clearance for general anesthesia. Please advise.   Minna Antis   05/13/2022, 8:32 AM

## 2022-05-14 NOTE — Telephone Encounter (Signed)
   Patient Name: Jose Cook  DOB: [birth year] MRN: [id number]  Primary Cardiologist: None  Chart reviewed as part of pre-operative protocol coverage. Given past medical history and time since last visit, based on ACC/AHA guidelines, Asher L Vanbeek is at acceptable risk for the planned procedure without further cardiovascular testing.   Per Dr. Shary Key, "He should be acceptable for general anesthesia."   I will route this recommendation to the requesting party via Epic fax function and remove from pre-op pool.  Please call with questions.  Joylene Grapes, NP 05/14/2022, 7:40 AM

## 2022-05-17 MED ORDER — OXYCODONE-ACETAMINOPHEN 10-325 MG PO TABS: 1.0000 | ORAL_TABLET | ORAL | 0 refills | Status: DC | PRN

## 2022-05-17 MED ORDER — ONDANSETRON HCL 4 MG PO TABS: 4.0000 mg | ORAL_TABLET | Freq: Three times a day (TID) | ORAL | 0 refills | Status: AC | PRN

## 2022-05-17 NOTE — Addendum Note (Signed)
Addended by: Lenn Sink on: 05/17/2022 05:21 PM   Modules accepted: Orders

## 2022-05-18 DIAGNOSIS — M21542 Acquired clubfoot, left foot: Secondary | ICD-10-CM

## 2022-05-18 DIAGNOSIS — D492 Neoplasm of unspecified behavior of bone, soft tissue, and skin: Secondary | ICD-10-CM

## 2022-05-18 DIAGNOSIS — M2042 Other hammer toe(s) (acquired), left foot: Secondary | ICD-10-CM

## 2022-05-24 ENCOUNTER — Ambulatory Visit (INDEPENDENT_AMBULATORY_CARE_PROVIDER_SITE_OTHER): Payer: Medicare Other | Admitting: Podiatry

## 2022-05-24 ENCOUNTER — Encounter: Payer: Self-pay | Admitting: Podiatry

## 2022-05-24 ENCOUNTER — Ambulatory Visit (INDEPENDENT_AMBULATORY_CARE_PROVIDER_SITE_OTHER): Payer: Medicare Other

## 2022-05-24 DIAGNOSIS — Z9889 Other specified postprocedural states: Secondary | ICD-10-CM

## 2022-05-24 NOTE — Progress Notes (Signed)
Subjective:   Patient ID: Jose Cook, male   DOB: 67 y.o.   MRN: [id number]   HPI Patient states he is doing well with surgery still having moderate discomfort but quite a bit better   ROS      Objective:  Physical Exam  Neurovascular status intact negative Denna Haggard' sign noted wound edges are healing well with quite a bit of postoperative bleeding but everything has settled down there is no drainage noted currently with pins in place second third fourth toes     Assessment:  Doing well post forefoot reconstruction left     Plan:  H&P x-ray reviewed sterile dressing reapplied continue elevation compression reappoint 2 weeks suture removal earlier if any issues were to occur continue boot usage full-time  X-rays indicate excellent positional component osteotomies digits fixation in place good alignment noted plantar incision healing well

## 2022-05-26 ENCOUNTER — Telehealth: Payer: Self-pay | Admitting: Podiatry

## 2022-05-26 NOTE — Telephone Encounter (Signed)
Patient's wife called answering service, he kicked the tip of the toe and the third toe patient has compressed deeper into the toe and the proximal short portion of the wire has protruded through the base of the toe through the incision.  Advised to maintain support with surgical shoe take gauze and Betadine and apply over the pin site and will have further follow-up scheduled for management.  Will need new x-rays.  Advised not to pull pin out or adjust at this point

## 2022-05-27 ENCOUNTER — Ambulatory Visit (INDEPENDENT_AMBULATORY_CARE_PROVIDER_SITE_OTHER): Payer: Medicare Other

## 2022-05-27 ENCOUNTER — Ambulatory Visit (INDEPENDENT_AMBULATORY_CARE_PROVIDER_SITE_OTHER): Payer: Medicare Other | Admitting: Podiatry

## 2022-05-27 DIAGNOSIS — M21611 Bunion of right foot: Secondary | ICD-10-CM

## 2022-05-27 DIAGNOSIS — M2042 Other hammer toe(s) (acquired), left foot: Secondary | ICD-10-CM

## 2022-05-27 NOTE — Progress Notes (Signed)
Subjective:   Patient ID: Jose Cook, male   DOB: 67 y.o.   MRN: [id number]   HPI Patient states he traumatized his third digit left and the pin is now sticking out of the skin with no real soreness but he stated he completely traumatized the toe and it occurred   ROS      Objective:  Physical Exam  Neurovascular status intact negative Denna Haggard' sign noted the third toe has been severely traumatized in the pin is sticking out in a dorsal direction by about 1 cm     Assessment:  Severe trauma to the left third digit with pins sticking in a dorsal direction     Plan:  H&P x-ray reviewed pin removed third toe toe splinted continue same course keep appointment for suture removal and will come in earlier if any issues were to occur.  Continue with boot usage immobilization  X-rays indicate that the pin has been completely dislocated and is in a dorsal sticking direction

## 2022-05-27 NOTE — Telephone Encounter (Signed)
Please have him come in today to be evaluated

## 2022-06-04 ENCOUNTER — Encounter: Payer: Self-pay | Admitting: Podiatry

## 2022-06-07 ENCOUNTER — Ambulatory Visit (INDEPENDENT_AMBULATORY_CARE_PROVIDER_SITE_OTHER): Payer: Medicare Other | Admitting: Podiatry

## 2022-06-07 ENCOUNTER — Ambulatory Visit (INDEPENDENT_AMBULATORY_CARE_PROVIDER_SITE_OTHER): Payer: Medicare Other

## 2022-06-07 ENCOUNTER — Encounter: Payer: Self-pay | Admitting: Podiatry

## 2022-06-07 DIAGNOSIS — M2042 Other hammer toe(s) (acquired), left foot: Secondary | ICD-10-CM

## 2022-06-09 NOTE — Progress Notes (Signed)
Subjective:   Patient ID: Jose Cook, male   DOB: 67 y.o.   MRN: [id number]   HPI Patient states doing well with surgery with everything in good alignment stitches intact pins intact   ROS      Objective:  Physical Exam  Neuro vas status intact negative Denna Haggard' sign noted digits in good alignment pins intact second digit incision sites healing well wound edges coapted well     Assessment:  Doing well post foot surgery left stitches intact     Plan:  I remove the most stitches I did leave some in Planter late to prevent gapping for 2 more weeks continue boot surgical shoe and reduced ambulation with complete immobilization and elevation as needed  X-rays indicate osteotomies are healing well digits are in good alignment fixation in place

## 2022-06-16 ENCOUNTER — Telehealth: Payer: Self-pay

## 2022-06-16 DIAGNOSIS — I10 Essential (primary) hypertension: Secondary | ICD-10-CM | POA: Insufficient documentation

## 2022-06-16 DIAGNOSIS — M25562 Pain in left knee: Secondary | ICD-10-CM | POA: Insufficient documentation

## 2022-06-16 DIAGNOSIS — Z0289 Encounter for other administrative examinations: Secondary | ICD-10-CM | POA: Insufficient documentation

## 2022-06-16 DIAGNOSIS — E785 Hyperlipidemia, unspecified: Secondary | ICD-10-CM | POA: Insufficient documentation

## 2022-06-16 NOTE — Telephone Encounter (Signed)
Hey Dr. Myrtie Neither,   I see previous encounters from 2 years ago where the patient was requested to been seen in office to discuss timing of his colonoscopy etc due to the patient having complete heart block with pacemaker. I do not see that the patient ever came on for his follow up with you. Please advise if this patient needs to been seen in the clinic prior to him proceeding with the colonoscopy or would you just like for cardiac clearance to be obtained.   Thank you, Pre visit

## 2022-06-17 ENCOUNTER — Ambulatory Visit (INDEPENDENT_AMBULATORY_CARE_PROVIDER_SITE_OTHER): Payer: Medicare Other

## 2022-06-17 DIAGNOSIS — I442 Atrioventricular block, complete: Secondary | ICD-10-CM

## 2022-06-17 LAB — CUP PACEART REMOTE DEVICE CHECK
Battery Remaining Longevity: 120 mo
Battery Voltage: 3.01 V
Brady Statistic AP VP Percent: 2.59 %
Brady Statistic AP VS Percent: 0.01 %
Brady Statistic AS VP Percent: 96.97 %
Brady Statistic AS VS Percent: 0.43 %
Brady Statistic RA Percent Paced: 2.63 %
Brady Statistic RV Percent Paced: 99.57 %
Date Time Interrogation Session: 20240529233449
Implantable Lead Connection Status: 753985
Implantable Lead Connection Status: 753985
Implantable Lead Implant Date: 20210901
Implantable Lead Implant Date: 20210901
Implantable Lead Location: 753859
Implantable Lead Location: 753860
Implantable Lead Model: 5076
Implantable Lead Model: 5076
Implantable Pulse Generator Implant Date: 20210901
Lead Channel Impedance Value: 323 Ohm
Lead Channel Impedance Value: 361 Ohm
Lead Channel Impedance Value: 437 Ohm
Lead Channel Impedance Value: 475 Ohm
Lead Channel Pacing Threshold Amplitude: 0.5 V
Lead Channel Pacing Threshold Amplitude: 0.5 V
Lead Channel Pacing Threshold Pulse Width: 0.4 ms
Lead Channel Pacing Threshold Pulse Width: 0.4 ms
Lead Channel Sensing Intrinsic Amplitude: 0.5 mV
Lead Channel Sensing Intrinsic Amplitude: 0.5 mV
Lead Channel Sensing Intrinsic Amplitude: 8.5 mV
Lead Channel Sensing Intrinsic Amplitude: 8.5 mV
Lead Channel Setting Pacing Amplitude: 1.5 V
Lead Channel Setting Pacing Amplitude: 2 V
Lead Channel Setting Pacing Pulse Width: 0.4 ms
Lead Channel Setting Sensing Sensitivity: 1.2 mV
Zone Setting Status: 755011

## 2022-06-17 NOTE — Telephone Encounter (Signed)
Thank you for the note.  I recall this patient-he came for screening colonoscopy and was found to be in third-degree heart block so he was hospitalized and had a pacemaker placed.  Afterward, I had recommended the office visit mainly because his wife reported he had been having diarrhea and weight loss.  However, subsequent phone note indicates those symptoms resolved.  I also reviewed his 05/11/2022 cardiology office note, at which time he was not felt to need any additional cardiac testing or changes in treatment prior to a podiatry surgery.  He can be directly booked for his colonoscopy with me as scheduled.  Dr. Myrtie Neither

## 2022-06-23 ENCOUNTER — Ambulatory Visit (INDEPENDENT_AMBULATORY_CARE_PROVIDER_SITE_OTHER): Payer: Medicare Other | Admitting: Podiatry

## 2022-06-23 ENCOUNTER — Ambulatory Visit (INDEPENDENT_AMBULATORY_CARE_PROVIDER_SITE_OTHER): Payer: Medicare Other

## 2022-06-23 ENCOUNTER — Encounter: Payer: Self-pay | Admitting: Podiatry

## 2022-06-23 DIAGNOSIS — M2042 Other hammer toe(s) (acquired), left foot: Secondary | ICD-10-CM

## 2022-06-23 NOTE — Progress Notes (Signed)
Subjective:   Patient ID: Jose Cook, male   DOB: 66 y.o.   MRN: [id number]   HPI Patient states doing very well needs some other stitches taken out and 1 pin fell out I have 1 left   ROS      Objective:  Physical Exam  Neurovascular status intact negative Denna Haggard' sign was noted left foot healing well wound edges well coapted pin in place second toe and states several stitches still remaining that were difficult to get out due to his foot structure     Assessment:  Doing well post surgery with so far everything coming along beautifully     Plan:  H&P x-rays reviewed remaining stitches taken out with may be several better left but they should come out on their own and dispensed a surgical shoe ankle compression stocking may increase activity and hopefully return to shoe gear in 2 weeks  X-rays indicate everything is healing well digits in good alignment metatarsal screws in good alignment

## 2022-06-24 ENCOUNTER — Ambulatory Visit (AMBULATORY_SURGERY_CENTER): Payer: Medicare Other

## 2022-06-24 ENCOUNTER — Encounter: Payer: Self-pay | Admitting: Gastroenterology

## 2022-06-24 VITALS — Ht 76.0 in | Wt 216.0 lb

## 2022-06-24 DIAGNOSIS — Z1211 Encounter for screening for malignant neoplasm of colon: Secondary | ICD-10-CM

## 2022-06-24 NOTE — Progress Notes (Signed)
No egg or soy allergy known to patient  No issues known to pt with past sedation with any surgeries or procedures Patient denies ever being told they had issues or difficulty with intubation  No FH of Malignant Hyperthermia Pt is not on diet pills Pt is not on  home 02  Pt is not on blood thinners  Pt denies issues with constipation  No A fib or A flutter - Complete heart block with Medtronic pacemaker  Have any cardiac testing pending-- no  Pt is ambulatory   Patient's chart reviewed by Cathlyn Parsons CNRA prior to previsit and patient appropriate for the [facility].  Previsit completed and red dot placed by patient's name on their procedure day (on provider's schedule).     PV completed. Prep instruction reviewed. Sent via my chart and home address. Pt advised to read all the highlighted areas once instructions are received and all the office back with any questions. Pt to purchase all prep products OTC

## 2022-07-04 ENCOUNTER — Encounter: Payer: Self-pay | Admitting: Certified Registered Nurse Anesthetist

## 2022-07-06 NOTE — Progress Notes (Signed)
Remote pacemaker transmission.   

## 2022-07-08 ENCOUNTER — Ambulatory Visit (AMBULATORY_SURGERY_CENTER): Payer: Medicare Other | Admitting: Gastroenterology

## 2022-07-08 ENCOUNTER — Encounter: Payer: Self-pay | Admitting: Gastroenterology

## 2022-07-08 VITALS — BP 130/74 | HR 61 | Temp 98.9°F | Resp 14 | Ht 76.0 in | Wt 216.0 lb

## 2022-07-08 DIAGNOSIS — Z1211 Encounter for screening for malignant neoplasm of colon: Secondary | ICD-10-CM

## 2022-07-08 DIAGNOSIS — D123 Benign neoplasm of transverse colon: Secondary | ICD-10-CM

## 2022-07-08 DIAGNOSIS — D122 Benign neoplasm of ascending colon: Secondary | ICD-10-CM | POA: Diagnosis not present

## 2022-07-08 DIAGNOSIS — D120 Benign neoplasm of cecum: Secondary | ICD-10-CM

## 2022-07-08 DIAGNOSIS — D125 Benign neoplasm of sigmoid colon: Secondary | ICD-10-CM

## 2022-07-08 MED ORDER — SODIUM CHLORIDE 0.9 % IV SOLN: 500.0000 mL | Freq: Once | INTRAVENOUS | Status: DC

## 2022-07-08 NOTE — Progress Notes (Signed)
Pt's states no medical or surgical changes since previsit or office visit. 

## 2022-07-08 NOTE — Progress Notes (Signed)
History and Physical:  This patient presents for endoscopic testing for: Encounter Diagnosis  Name Primary?   Special screening for malignant neoplasms, colon Yes    Average risk for colorectal cancer.  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   Patient is otherwise without complaints or active issues today.   Past Medical History: Past Medical History:  Diagnosis Date   Allergy    seasonal allergies   Gout    HTN (hypertension)    on meds   Hyperlipidemia    on meds   Pacemaker      Past Surgical History: Past Surgical History:  Procedure Laterality Date   COLONOSCOPY  2010   normal-recall 95yrs   KNEE SURGERY Left    x 2 sx   PACEMAKER IMPLANT N/A 09/19/2019   Procedure: PACEMAKER IMPLANT;  Surgeon: Duke Salvia, MD;  Location: [facility] INVASIVE CV LAB;  Service: Cardiovascular;  Laterality: N/A;   ROTATOR CUFF REPAIR Left     Allergies: No Known Allergies  Outpatient Meds: Current Outpatient Medications  Medication Sig Dispense Refill   allopurinol (ZYLOPRIM) 300 MG tablet Take 300 mg by mouth daily.      amLODipine (NORVASC) 10 MG tablet Take 10 mg by mouth daily.     gemfibrozil (LOPID) 600 MG tablet Take 600 mg by mouth daily.     losartan (COZAAR) 100 MG tablet Take 100 mg by mouth daily.     Omega-3 Fatty Acids (FISH OIL PO) Take by mouth.     ondansetron (ZOFRAN) 4 MG tablet Take 1 tablet (4 mg total) by mouth every 8 (eight) hours as needed for nausea or vomiting. 20 tablet 0   ALPRAZolam (XANAX) 1 MG tablet Take 1 mg by mouth as needed (TAKE ONE TABLET BY MOUTH AS NEEDED ONE HOUR BEFORE FLIGHT FOR ANXIETY).     gabapentin (NEURONTIN) 300 MG capsule Take 1 capsule (300 mg total) by mouth 3 (three) times daily. (Patient taking differently: Take 300 mg by mouth 3 (three) times daily as needed.) 90 capsule 3   Menthol, Topical Analgesic, (BIOFREEZE EX) Apply 2 sprays topically  3 (three) times daily as needed (pain). (Patient not taking: Reported on 06/24/2022)     sildenafil (VIAGRA) 100 MG tablet Take 50-100 mg by mouth daily as needed for erectile dysfunction.      Current Facility-Administered Medications  Medication Dose Route Frequency Provider Last Rate Last Admin   0.9 %  sodium chloride infusion  500 mL Intravenous Once Charlie Pitter III, MD          ___________________________________________________________________ Objective   Exam:  BP (!) 159/90   Pulse 75   Temp 98.9 F (37.2 C) (Temporal)   Resp 10   Ht 6\' 4"  (1.93 m)   Wt 216 lb (98 kg)   SpO2 100%   BMI 26.29 kg/m   CV: regular , S1/S2 - pacer Left upper chest wall Resp: clear to auscultation bilaterally, normal RR and effort noted GI: soft, no tenderness, with active bowel sounds.   Assessment: Encounter Diagnosis  Name Primary?   Special screening for malignant neoplasms, colon Yes     Plan: Colonoscopy   The benefits and risks of the planned procedure were described in detail with the patient or (when appropriate) their health care proxy.  Risks were outlined as including, but not limited to, bleeding, infection, perforation, adverse medication reaction  leading to cardiac or pulmonary decompensation, pancreatitis (if ERCP).  The limitation of incomplete mucosal visualization was also discussed.  No guarantees or warranties were given.  The patient is appropriate for an endoscopic procedure in the ambulatory setting.   - Amada Jupiter, MD

## 2022-07-08 NOTE — Progress Notes (Signed)
Called to room to assist during endoscopic procedure.  Patient ID and intended procedure confirmed with present staff. Received instructions for my participation in the procedure from the performing physician.  

## 2022-07-08 NOTE — Op Note (Signed)
[facility] Patient Name: Jose Cook Procedure Date: 07/08/2022 6:52 AM MRN: [id number] Endoscopist: Sherilyn Cooter L. Myrtie Neither , MD, [id number] Age: 67 Referring MD:  Date of Birth: 10-19-1955 Gender: Male Account #: [id number] Procedure:                Colonoscopy Indications:              Screening for colorectal malignant neoplasm                           no polyps last colonoscopy Aug 2010 Medicines:                Monitored Anesthesia Care Procedure:                Pre-Anesthesia Assessment:                           - Prior to the procedure, a History and Physical                            was performed, and patient medications and                            allergies were reviewed. The patient's tolerance of                            previous anesthesia was also reviewed. The risks                            and benefits of the procedure and the sedation                            options and risks were discussed with the patient.                            All questions were answered, and informed consent                            was obtained. Prior Anticoagulants: The patient has                            taken no anticoagulant or antiplatelet agents. ASA                            Grade Assessment: III - A patient with severe                            systemic disease. After reviewing the risks and                            benefits, the patient was deemed in satisfactory                            condition to undergo the procedure.  After obtaining informed consent, the colonoscope                            was passed under direct vision. Throughout the                            procedure, the patient's blood pressure, pulse, and                            oxygen saturations were monitored continuously. The                            Olympus CF-HQ190L [id number]) Colonoscope was                            introduced through the anus and  advanced to the the                            cecum, identified by appendiceal orifice and                            ileocecal valve. The colonoscopy was performed                            without difficulty. The patient tolerated the                            procedure well. The quality of the bowel                            preparation was good. The ileocecal valve,                            appendiceal orifice, and rectum were photographed. Scope In: 8:12:57 AM Scope Out: 8:36:03 AM Scope Withdrawal Time: 0 hours 20 minutes 46 seconds  Total Procedure Duration: 0 hours 23 minutes 6 seconds  Findings:                 The perianal and digital rectal examinations were                            normal.                           Three sessile and semi-sessile polyps were found in                            the distal ascending colon and ileocecal valve. The                            polyps were 4 to 15 mm in size (largest on ICV,                            best seen under NBI). These polyps were removed  with a cold snare. Resection and retrieval were                            complete.                           Two sessile polyps were found in the proximal                            sigmoid colon and proximal ascending colon. The                            polyps were diminutive in size. These polyps were                            removed with a cold snare. Resection and retrieval                            were complete.                           Repeat examination of right colon under NBI                            performed.                           Diverticula were found in the left colon.                           The exam was otherwise without abnormality on                            direct and retroflexion views. Complications:            No immediate complications. Estimated Blood Loss:     Estimated blood loss was minimal. Impression:                - Three 4 to 15 mm polyps in the distal ascending                            colon and at the ileocecal valve, removed with a                            cold snare. Resected and retrieved.                           - Two diminutive polyps in the proximal sigmoid                            colon and in the proximal ascending colon, removed                            with a cold snare. Resected and retrieved.                           -  Diverticulosis in the left colon.                           - The examination was otherwise normal on direct                            and retroflexion views. Recommendation:           - Patient has a contact number available for                            emergencies. The signs and symptoms of potential                            delayed complications were discussed with the                            patient. Return to normal activities tomorrow.                            Written discharge instructions were provided to the                            patient.                           - Resume previous diet.                           - Continue present medications.                           - Await pathology results.                           - Repeat colonoscopy is recommended for                            surveillance. The colonoscopy date will be                            determined after pathology results from today's                            exam become available for review. Berlin Mokry L. Myrtie Neither, MD 07/08/2022 8:41:14 AM This report has been signed electronically.

## 2022-07-08 NOTE — Patient Instructions (Addendum)
Resume previous diet. Continue present medications. Await pathology results. Repeat colonoscopy is recommended for surveillance. The colonoscopy date will be determined after pathology results from today's exam become available for review.   YOU HAD AN ENDOSCOPIC PROCEDURE TODAY AT THE [facility]:   Refer to the procedure report that was given to you for any specific questions about what was found during the examination.  If the procedure report does not answer your questions, please call your gastroenterologist to clarify.  If you requested that your care partner not be given the details of your procedure findings, then the procedure report has been included in a sealed envelope for you to review at your convenience later.  YOU SHOULD EXPECT: Some feelings of bloating in the abdomen. Passage of more gas than usual.  Walking can help get rid of the air that was put into your GI tract during the procedure and reduce the bloating. If you had a lower endoscopy (such as a colonoscopy or flexible sigmoidoscopy) you may notice spotting of blood in your stool or on the toilet paper. If you underwent a bowel prep for your procedure, you may not have a normal bowel movement for a few days.  Please Note:  You might notice some irritation and congestion in your nose or some drainage.  This is from the oxygen used during your procedure.  There is no need for concern and it should clear up in a day or so.  SYMPTOMS TO REPORT IMMEDIATELY:  Following lower endoscopy (colonoscopy or flexible sigmoidoscopy):  Excessive amounts of blood in the stool  Significant tenderness or worsening of abdominal pains  Swelling of the abdomen that is new, acute  Fever of 100F or higher   For urgent or emergent issues, a gastroenterologist can be reached at any hour by calling [phone]. Do not use MyChart messaging for urgent concerns.    DIET:  We do recommend a small meal at first, but then you may  proceed to your regular diet.  Drink plenty of fluids but you should avoid alcoholic beverages for 24 hours.  ACTIVITY:  You should plan to take it easy for the rest of today and you should NOT DRIVE or use heavy machinery until tomorrow (because of the sedation medicines used during the test).    FOLLOW UP: Our staff will call the number listed on your records the next business day following your procedure.  We will call around 7:15- 8:00 am to check on you and address any questions or concerns that you may have regarding the information given to you following your procedure. If we do not reach you, we will leave a message.     If any biopsies were taken you will be contacted by phone or by letter within the next 1-3 weeks.  Please call us at [phone] if you have not heard about the biopsies in 3 weeks.    SIGNATURES/CONFIDENTIALITY: You and/or your care partner have signed paperwork which will be entered into your electronic medical record.  These signatures attest to the fact that that the information above on your After Visit Summary has been reviewed and is understood.  Full responsibility of the confidentiality of this discharge information lies with you and/or your care-partner. 

## 2022-07-08 NOTE — Progress Notes (Signed)
Report given to PACU, vss 

## 2022-07-09 ENCOUNTER — Telehealth: Payer: Self-pay

## 2022-07-09 NOTE — Telephone Encounter (Signed)
Follow up call to pt, lm for pt to call if having any difficulty with normal activities or eating and drinking.  Also to call if any other questions or concerns.  

## 2022-07-19 ENCOUNTER — Encounter: Payer: Self-pay | Admitting: Gastroenterology

## 2022-07-21 ENCOUNTER — Ambulatory Visit (INDEPENDENT_AMBULATORY_CARE_PROVIDER_SITE_OTHER): Payer: Medicare Other | Admitting: Podiatry

## 2022-07-21 ENCOUNTER — Ambulatory Visit (INDEPENDENT_AMBULATORY_CARE_PROVIDER_SITE_OTHER): Payer: Medicare Other

## 2022-07-21 ENCOUNTER — Encounter: Payer: Self-pay | Admitting: Podiatry

## 2022-07-21 ENCOUNTER — Other Ambulatory Visit: Payer: Self-pay | Admitting: Podiatry

## 2022-07-21 DIAGNOSIS — Z9889 Other specified postprocedural states: Secondary | ICD-10-CM

## 2022-07-21 DIAGNOSIS — M2042 Other hammer toe(s) (acquired), left foot: Secondary | ICD-10-CM | POA: Diagnosis not present

## 2022-07-21 NOTE — Progress Notes (Signed)
Subjective:   Patient ID: Jose Cook, male   DOB: 67 y.o.   MRN: [id number]   HPI Patient states overall doing good but just still having some swelling that I want to check   ROS      Objective:  Physical Exam  Neurovasc status intact negative Denna Haggard' sign noted wound edges well coapted with patient's foot healing well with good alignment noted and good range of motion noted.     Assessment:  Doing well post forefoot reconstruction left excision plantar fibroma     Plan:  H&P reviewed at this point I have recommended support therapy and gradual increase in activities and that swelling at this time is normal.  Reappoint to recheck as needed  X-rays indicate screws are intact alignment is good

## 2022-07-26 IMAGING — DX DG CHEST 1V PORT
1 series · 1 of 1 positions shown · non-contrast
Comparison: 07/25/2008.

CLINICAL DATA: Abnormal EKG.

EXAM:
PORTABLE CHEST 1 VIEW

[chest]
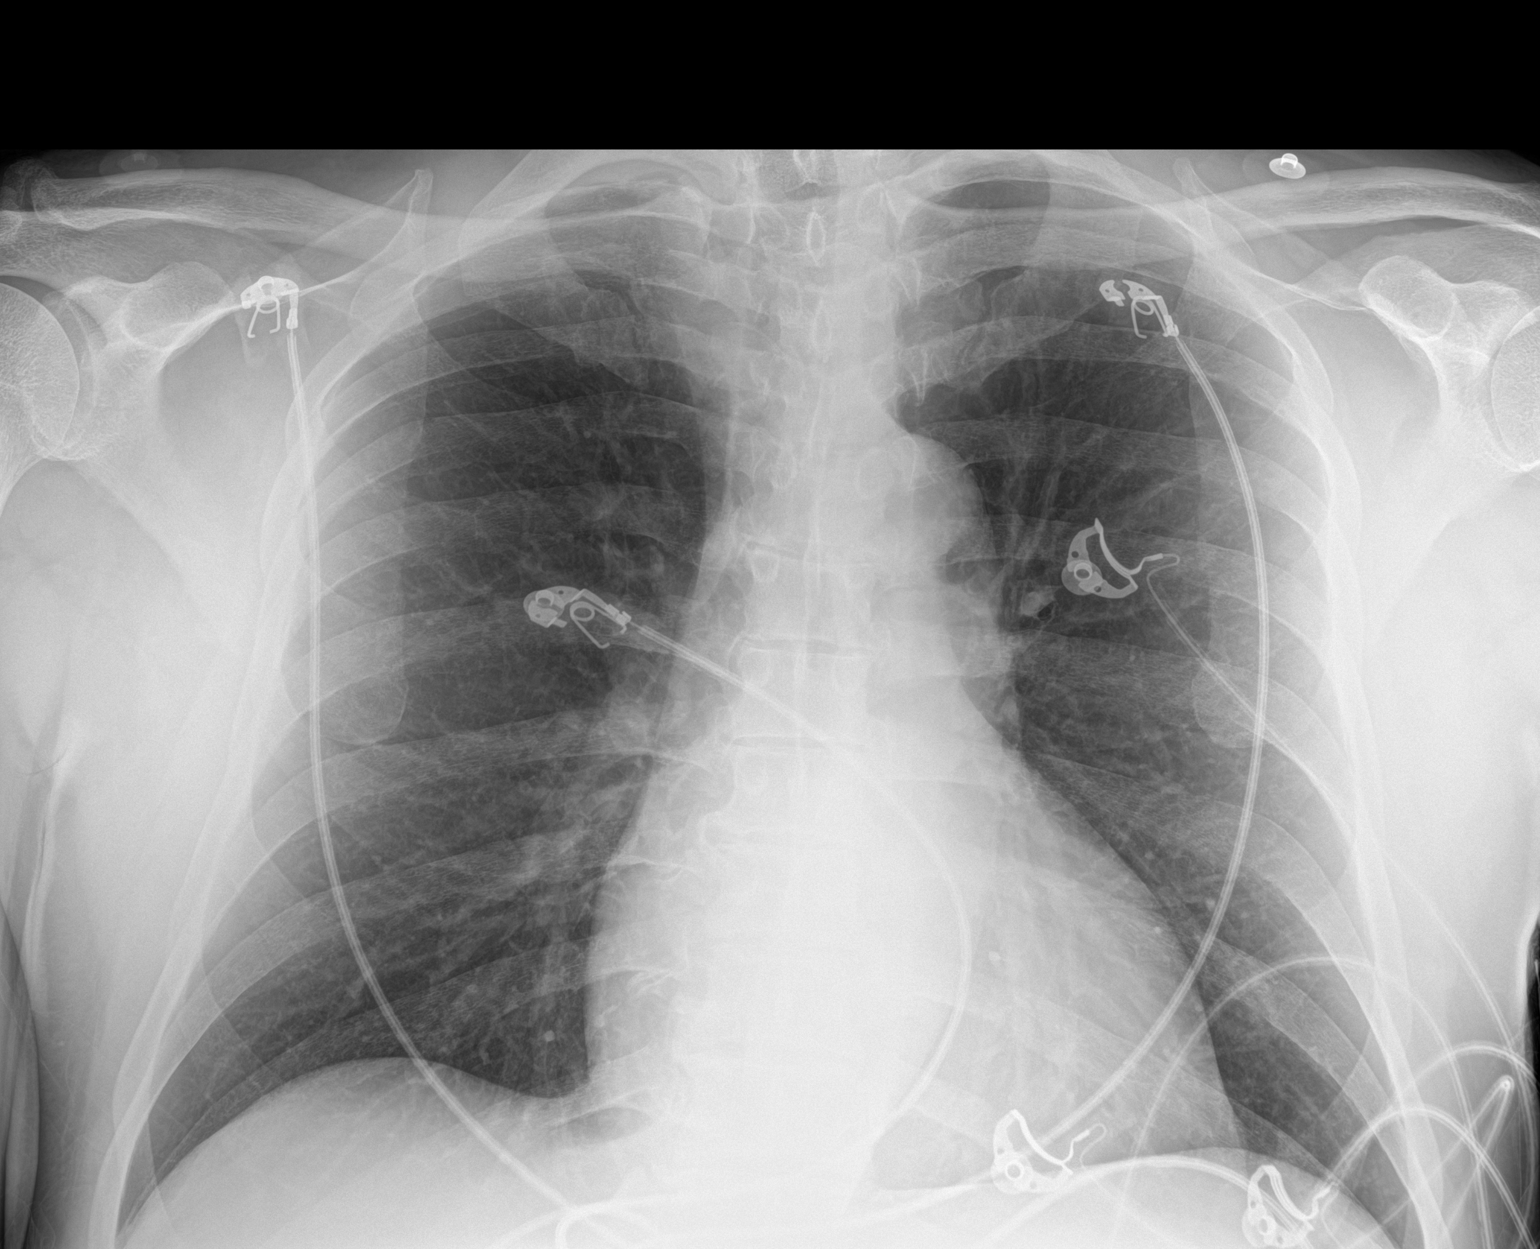

[1 of 1 positions shown; findings below may reference images not displayed]

FINDINGS: Mediastinum and hilar structures normal. Borderline cardiomegaly. No
pulmonary venous congestion. No focal infiltrate. No pleural
effusion or pneumothorax. Left costophrenic angle incompletely
imaged. No acute bony abnormality.
IMPRESSION: Borderline cardiomegaly. No pulmonary venous congestion. No acute
pulmonary disease.

## 2022-09-16 ENCOUNTER — Ambulatory Visit (INDEPENDENT_AMBULATORY_CARE_PROVIDER_SITE_OTHER): Payer: Medicare Other

## 2022-09-16 DIAGNOSIS — I442 Atrioventricular block, complete: Secondary | ICD-10-CM

## 2022-09-16 LAB — CUP PACEART REMOTE DEVICE CHECK
Battery Remaining Longevity: 116 mo
Battery Voltage: 3.01 V
Brady Statistic AP VP Percent: 2.1 %
Brady Statistic AP VS Percent: 0 %
Brady Statistic AS VP Percent: 97.11 %
Brady Statistic AS VS Percent: 0.79 %
Brady Statistic RA Percent Paced: 2.21 %
Brady Statistic RV Percent Paced: 99.21 %
Date Time Interrogation Session: 20240828200733
Implantable Lead Connection Status: 753985
Implantable Lead Connection Status: 753985
Implantable Lead Implant Date: 20210901
Implantable Lead Implant Date: 20210901
Implantable Lead Location: 753859
Implantable Lead Location: 753860
Implantable Lead Model: 5076
Implantable Lead Model: 5076
Implantable Pulse Generator Implant Date: 20210901
Lead Channel Impedance Value: 323 Ohm
Lead Channel Impedance Value: 361 Ohm
Lead Channel Impedance Value: 399 Ohm
Lead Channel Impedance Value: 456 Ohm
Lead Channel Pacing Threshold Amplitude: 0.375 V
Lead Channel Pacing Threshold Amplitude: 0.5 V
Lead Channel Pacing Threshold Pulse Width: 0.4 ms
Lead Channel Pacing Threshold Pulse Width: 0.4 ms
Lead Channel Sensing Intrinsic Amplitude: 0.5 mV
Lead Channel Sensing Intrinsic Amplitude: 0.5 mV
Lead Channel Sensing Intrinsic Amplitude: 6.75 mV
Lead Channel Sensing Intrinsic Amplitude: 6.75 mV
Lead Channel Setting Pacing Amplitude: 1.5 V
Lead Channel Setting Pacing Amplitude: 2 V
Lead Channel Setting Pacing Pulse Width: 0.4 ms
Lead Channel Setting Sensing Sensitivity: 1.2 mV
Zone Setting Status: 755011

## 2022-09-21 ENCOUNTER — Ambulatory Visit (INDEPENDENT_AMBULATORY_CARE_PROVIDER_SITE_OTHER): Payer: Medicare Other

## 2022-09-21 DIAGNOSIS — I442 Atrioventricular block, complete: Secondary | ICD-10-CM

## 2022-09-21 LAB — CUP PACEART REMOTE DEVICE CHECK
Battery Remaining Longevity: 117 mo
Battery Voltage: 3.01 V
Brady Statistic AP VP Percent: 0.9 %
Brady Statistic AP VS Percent: 0 %
Brady Statistic AS VP Percent: 98.04 %
Brady Statistic AS VS Percent: 1.06 %
Brady Statistic RA Percent Paced: 1.04 %
Brady Statistic RV Percent Paced: 98.93 %
Date Time Interrogation Session: 20240902203539
Implantable Lead Connection Status: 753985
Implantable Lead Connection Status: 753985
Implantable Lead Implant Date: 20210901
Implantable Lead Implant Date: 20210901
Implantable Lead Location: 753859
Implantable Lead Location: 753860
Implantable Lead Model: 5076
Implantable Lead Model: 5076
Implantable Pulse Generator Implant Date: 20210901
Lead Channel Impedance Value: 323 Ohm
Lead Channel Impedance Value: 361 Ohm
Lead Channel Impedance Value: 437 Ohm
Lead Channel Impedance Value: 475 Ohm
Lead Channel Pacing Threshold Amplitude: 0.5 V
Lead Channel Pacing Threshold Amplitude: 0.5 V
Lead Channel Pacing Threshold Pulse Width: 0.4 ms
Lead Channel Pacing Threshold Pulse Width: 0.4 ms
Lead Channel Sensing Intrinsic Amplitude: 1.5 mV
Lead Channel Sensing Intrinsic Amplitude: 1.5 mV
Lead Channel Sensing Intrinsic Amplitude: 6.625 mV
Lead Channel Sensing Intrinsic Amplitude: 6.625 mV
Lead Channel Setting Pacing Amplitude: 1.5 V
Lead Channel Setting Pacing Amplitude: 2 V
Lead Channel Setting Pacing Pulse Width: 0.4 ms
Lead Channel Setting Sensing Sensitivity: 1.2 mV
Zone Setting Status: 755011

## 2022-09-22 ENCOUNTER — Telehealth: Payer: Self-pay | Admitting: Podiatry

## 2022-09-22 NOTE — Telephone Encounter (Signed)
Pt called stating he has neuropathy and is asking if you could send him to the necropathy specialist

## 2022-09-23 NOTE — Telephone Encounter (Signed)
There isn't a neuropathy specialist

## 2022-09-23 NOTE — Progress Notes (Signed)
Remote pacemaker transmission.   

## 2022-09-24 NOTE — Telephone Encounter (Signed)
Left message for pt that per Dr Charlsie Merles there is no neuropathy specialist and to call if any further questions

## 2022-09-29 NOTE — Progress Notes (Signed)
Remote pacemaker transmission.   

## 2022-12-21 ENCOUNTER — Ambulatory Visit (INDEPENDENT_AMBULATORY_CARE_PROVIDER_SITE_OTHER): Payer: Medicare Other

## 2022-12-21 DIAGNOSIS — I442 Atrioventricular block, complete: Secondary | ICD-10-CM

## 2022-12-21 LAB — CUP PACEART REMOTE DEVICE CHECK
Battery Remaining Longevity: 114 mo
Battery Voltage: 3 V
Brady Statistic AP VP Percent: 2.36 %
Brady Statistic AP VS Percent: 0.02 %
Brady Statistic AS VP Percent: 96.86 %
Brady Statistic AS VS Percent: 0.76 %
Brady Statistic RA Percent Paced: 2.47 %
Brady Statistic RV Percent Paced: 99.22 %
Date Time Interrogation Session: 20241202213229
Implantable Lead Connection Status: 753985
Implantable Lead Connection Status: 753985
Implantable Lead Implant Date: 20210901
Implantable Lead Implant Date: 20210901
Implantable Lead Location: 753859
Implantable Lead Location: 753860
Implantable Lead Model: 5076
Implantable Lead Model: 5076
Implantable Pulse Generator Implant Date: 20210901
Lead Channel Impedance Value: 323 Ohm
Lead Channel Impedance Value: 361 Ohm
Lead Channel Impedance Value: 437 Ohm
Lead Channel Impedance Value: 475 Ohm
Lead Channel Pacing Threshold Amplitude: 0.5 V
Lead Channel Pacing Threshold Amplitude: 0.625 V
Lead Channel Pacing Threshold Pulse Width: 0.4 ms
Lead Channel Pacing Threshold Pulse Width: 0.4 ms
Lead Channel Sensing Intrinsic Amplitude: 0.625 mV
Lead Channel Sensing Intrinsic Amplitude: 0.625 mV
Lead Channel Sensing Intrinsic Amplitude: 6.25 mV
Lead Channel Sensing Intrinsic Amplitude: 6.25 mV
Lead Channel Setting Pacing Amplitude: 1.5 V
Lead Channel Setting Pacing Amplitude: 2 V
Lead Channel Setting Pacing Pulse Width: 0.4 ms
Lead Channel Setting Sensing Sensitivity: 1.2 mV
Zone Setting Status: 755011

## 2023-03-22 ENCOUNTER — Ambulatory Visit (INDEPENDENT_AMBULATORY_CARE_PROVIDER_SITE_OTHER): Payer: 59

## 2023-03-22 DIAGNOSIS — I442 Atrioventricular block, complete: Secondary | ICD-10-CM

## 2023-03-24 LAB — CUP PACEART REMOTE DEVICE CHECK
Battery Remaining Longevity: 113 mo
Battery Voltage: 3 V
Brady Statistic AP VP Percent: 0.73 %
Brady Statistic AP VS Percent: 0.01 %
Brady Statistic AS VP Percent: 97.59 %
Brady Statistic AS VS Percent: 1.67 %
Brady Statistic RA Percent Paced: 0.84 %
Brady Statistic RV Percent Paced: 98.32 %
Date Time Interrogation Session: 20250303230708
Implantable Lead Connection Status: 753985
Implantable Lead Connection Status: 753985
Implantable Lead Implant Date: 20210901
Implantable Lead Implant Date: 20210901
Implantable Lead Location: 753859
Implantable Lead Location: 753860
Implantable Lead Model: 5076
Implantable Lead Model: 5076
Implantable Pulse Generator Implant Date: 20210901
Lead Channel Impedance Value: 342 Ohm
Lead Channel Impedance Value: 380 Ohm
Lead Channel Impedance Value: 475 Ohm
Lead Channel Impedance Value: 513 Ohm
Lead Channel Pacing Threshold Amplitude: 0.5 V
Lead Channel Pacing Threshold Amplitude: 0.5 V
Lead Channel Pacing Threshold Pulse Width: 0.4 ms
Lead Channel Pacing Threshold Pulse Width: 0.4 ms
Lead Channel Sensing Intrinsic Amplitude: 0.5 mV
Lead Channel Sensing Intrinsic Amplitude: 0.5 mV
Lead Channel Sensing Intrinsic Amplitude: 7.125 mV
Lead Channel Sensing Intrinsic Amplitude: 7.125 mV
Lead Channel Setting Pacing Amplitude: 1.5 V
Lead Channel Setting Pacing Amplitude: 2 V
Lead Channel Setting Pacing Pulse Width: 0.4 ms
Lead Channel Setting Sensing Sensitivity: 1.2 mV
Zone Setting Status: 755011

## 2023-04-26 NOTE — Progress Notes (Signed)
 Remote pacemaker transmission.

## 2023-04-26 NOTE — Addendum Note (Signed)
 Addended by: Geralyn Flash D on: 04/26/2023 09:55 AM   Modules accepted: Orders

## 2023-04-30 ENCOUNTER — Encounter: Payer: Self-pay | Admitting: Internal Medicine

## 2023-05-09 ENCOUNTER — Ambulatory Visit: Payer: Medicare Other | Attending: Internal Medicine | Admitting: Internal Medicine

## 2023-05-09 ENCOUNTER — Encounter: Payer: Self-pay | Admitting: Internal Medicine

## 2023-05-09 VITALS — BP 162/92 | HR 74 | Ht 76.0 in | Wt 204.4 lb

## 2023-05-09 DIAGNOSIS — I442 Atrioventricular block, complete: Secondary | ICD-10-CM | POA: Diagnosis not present

## 2023-05-09 DIAGNOSIS — I4729 Other ventricular tachycardia: Secondary | ICD-10-CM | POA: Diagnosis not present

## 2023-05-09 DIAGNOSIS — I429 Cardiomyopathy, unspecified: Secondary | ICD-10-CM | POA: Diagnosis not present

## 2023-05-09 DIAGNOSIS — Z950 Presence of cardiac pacemaker: Secondary | ICD-10-CM

## 2023-05-09 NOTE — Patient Instructions (Addendum)
 Medication Instructions:  Your physician recommends that you continue on your current medications as directed. Please refer to the Current Medication list given to you today.  *If you need a refill on your cardiac medications before your next appointment, please call your pharmacy*  Lab Work: None ordered.  If you have labs (blood work) drawn today and your tests are completely normal, you will receive your results only by: MyChart Message (if you have MyChart) OR A paper copy in the mail If you have any lab test that is abnormal or we need to change your treatment, we will call you to review the results.  Testing/Procedures: None ordered.   Follow-Up: At [facility], you and your health needs are our priority.  As part of our continuing mission to provide you with exceptional heart care, our providers are all part of one team.  This team includes your primary Cardiologist (physician) and Advanced Practice Providers or APPs (Physician Assistants and Nurse Practitioners) who all work together to provide you with the care you need, when you need it.  Your next appointment:   12 months      1st Floor: - Lobby - Registration  - Pharmacy  - Lab - Cafe  2nd Floor: - PV Lab - Diagnostic Testing (echo, CT, nuclear med)  3rd Floor: - Vacant  4th Floor: - TCTS (cardiothoracic surgery) - AFib Clinic - Structural Heart Clinic - Vascular Surgery  - Vascular Ultrasound  5th Floor: - Heart[facility] Cardiology (general and EP) - Clinical Pharmacy for coumadin, hypertension, lipid, weight-loss medications, and med management appointments    Valet parking services will be available as well.

## 2023-05-09 NOTE — Progress Notes (Signed)
 Patient Care Team: Mordechai April, DO as PCP - General (Family Medicine)   HPI  Jose Cook is a 68 y.o. male seen in follow-up for Medtronic pacemaker implanted 9/21 for complete heart block.  Found to have VT NS  The patient denies chest pain, shortness of breath, nocturnal dyspnea, orthopnea or peripheral edema.  There have been no palpitations, lightheadedness or syncope. Aaron Aas    DATE TEST EF   8/21 Echo   60-65 %   9/21 cMRI*  65 % No LGE   3/22 PET  Normal     Date Cr K Hgb  8/21   13.9  4/24 1.29 3.7             Records and Results Reviewed   Past Medical History:  Diagnosis Date   Allergy    seasonal allergies   Gout    HTN (hypertension)    on meds   Hyperlipidemia    on meds   Pacemaker     Past Surgical History:  Procedure Laterality Date   COLONOSCOPY  2010   normal-recall 18yrs   KNEE SURGERY Left    x 2 sx   PACEMAKER IMPLANT N/A 09/19/2019   Procedure: PACEMAKER IMPLANT;  Surgeon: Verona Goodwill, MD;  Location: [facility] INVASIVE CV LAB;  Service: Cardiovascular;  Laterality: N/A;   ROTATOR CUFF REPAIR Left     Current Meds  Medication Sig   allopurinol  (ZYLOPRIM ) 300 MG tablet Take 300 mg by mouth daily.    amLODipine (NORVASC) 10 MG tablet Take 10 mg by mouth daily.   DULoxetine (CYMBALTA) 30 MG capsule Take 90 mg by mouth daily.   gabapentin  (NEURONTIN ) 300 MG capsule Take 1 capsule (300 mg total) by mouth 3 (three) times daily. (Patient taking differently: Take 300 mg by mouth 3 (three) times daily as needed.)   gemfibrozil (LOPID) 600 MG tablet Take 600 mg by mouth daily.   losartan (COZAAR) 100 MG tablet Take 100 mg by mouth daily.   Menthol, Topical Analgesic, (BIOFREEZE EX) Apply 2 sprays topically 3 (three) times daily as needed (pain).   Omega-3 Fatty Acids (FISH OIL PO) Take by mouth.   ondansetron  (ZOFRAN ) 4 MG tablet Take 1 tablet (4 mg total) by mouth every 8 (eight) hours as needed for nausea or vomiting.   sildenafil (VIAGRA)  100 MG tablet Take 50-100 mg by mouth daily as needed for erectile dysfunction.     No Known Allergies     Review of Systems negative except from HPI and PMH  Physical Exam BP (!) 162/92   Pulse 74   Ht 6\' 4"  (1.93 m)   Wt 204 lb 6.4 oz (92.7 kg)   SpO2 98%   BMI 24.88 kg/m  Well developed and well nourished in no acute distress HENT normal Neck supple with JVP-flat Clear Device pocket well healed; without hematoma or erythema.  There is no tethering  Regular rate and rhythm, no  gallop No / murmur Abd-soft with active BS No Clubbing cyanosis  edema Skin-warm and dry A & Oriented  Grossly normal sensory and motor function  ECG sinus with narrow QRS  Device function is normal. Programming changes extend of the AV delay See Paceart for details    CrCl cannot be calculated (Patient's most recent lab result is older than the maximum 21 days allowed.).   Assessment and  Plan  Complete heart block  VT nonsustained  Pacemaker Medtronic   Sinus tachycardia  Blood pressure is poorly controlled.  But he has not been taking his medications.  Complete heart block is intermittent.  He is 98% ventricularly paced, but this is with pseudofusion.  We will lengthen the AV delay  No nonsustained VT

## 2023-05-10 LAB — CUP PACEART INCLINIC DEVICE CHECK
Date Time Interrogation Session: 20250421151500
Implantable Lead Connection Status: 753985
Implantable Lead Connection Status: 753985
Implantable Lead Implant Date: 20210901
Implantable Lead Implant Date: 20210901
Implantable Lead Location: 753859
Implantable Lead Location: 753860
Implantable Lead Model: 5076
Implantable Lead Model: 5076
Implantable Pulse Generator Implant Date: 20210901

## 2023-06-21 ENCOUNTER — Ambulatory Visit (INDEPENDENT_AMBULATORY_CARE_PROVIDER_SITE_OTHER): Payer: 59

## 2023-06-21 DIAGNOSIS — I442 Atrioventricular block, complete: Secondary | ICD-10-CM

## 2023-06-21 LAB — CUP PACEART REMOTE DEVICE CHECK
Battery Remaining Longevity: 107 mo
Battery Voltage: 3.01 V
Brady Statistic AP VP Percent: 0.01 %
Brady Statistic AP VS Percent: 0.84 %
Brady Statistic AS VP Percent: 0.03 %
Brady Statistic AS VS Percent: 99.12 %
Brady Statistic RA Percent Paced: 1.05 %
Brady Statistic RV Percent Paced: 0.04 %
Date Time Interrogation Session: 20250603012952
Implantable Lead Connection Status: 753985
Implantable Lead Connection Status: 753985
Implantable Lead Implant Date: 20210901
Implantable Lead Implant Date: 20210901
Implantable Lead Location: 753859
Implantable Lead Location: 753860
Implantable Lead Model: 5076
Implantable Lead Model: 5076
Implantable Pulse Generator Implant Date: 20210901
Lead Channel Impedance Value: 323 Ohm
Lead Channel Impedance Value: 361 Ohm
Lead Channel Impedance Value: 399 Ohm
Lead Channel Impedance Value: 437 Ohm
Lead Channel Pacing Threshold Amplitude: 0.375 V
Lead Channel Pacing Threshold Amplitude: 0.5 V
Lead Channel Pacing Threshold Pulse Width: 0.4 ms
Lead Channel Pacing Threshold Pulse Width: 0.4 ms
Lead Channel Sensing Intrinsic Amplitude: 1 mV
Lead Channel Sensing Intrinsic Amplitude: 1 mV
Lead Channel Sensing Intrinsic Amplitude: 5.625 mV
Lead Channel Sensing Intrinsic Amplitude: 5.625 mV
Lead Channel Setting Pacing Amplitude: 1.5 V
Lead Channel Setting Pacing Amplitude: 2 V
Lead Channel Setting Pacing Pulse Width: 0.4 ms
Lead Channel Setting Sensing Sensitivity: 1.2 mV
Zone Setting Status: 755011

## 2023-06-29 ENCOUNTER — Encounter (HOSPITAL_COMMUNITY): Payer: Self-pay | Admitting: *Deleted

## 2023-06-29 ENCOUNTER — Ambulatory Visit (HOSPITAL_COMMUNITY)
Admission: EM | Admit: 2023-06-29 | Discharge: 2023-06-29 | Disposition: A | Discharge status: Home / Self Care | Attending: Internal Medicine | Admitting: Internal Medicine

## 2023-06-29 DIAGNOSIS — M109 Gout, unspecified: Secondary | ICD-10-CM | POA: Diagnosis not present

## 2023-06-29 DIAGNOSIS — R2232 Localized swelling, mass and lump, left upper limb: Secondary | ICD-10-CM | POA: Insufficient documentation

## 2023-06-29 LAB — BASIC METABOLIC PANEL WITH GFR
Anion gap: 9 (ref 5–15)
BUN: 25 mg/dL — ABNORMAL HIGH (ref 8–23)
CO2: 22 mmol/L (ref 22–32)
Calcium: 9.4 mg/dL (ref 8.9–10.3)
Chloride: 110 mmol/L (ref 98–111)
Creatinine, Ser: 1.55 mg/dL — ABNORMAL HIGH (ref 0.61–1.24)
GFR, Estimated: 49 mL/min — ABNORMAL LOW (ref >60)
Glucose, Bld: 107 mg/dL — ABNORMAL HIGH (ref 70–99)
Potassium: 2.9 mmol/L — ABNORMAL LOW (ref 3.5–5.1)
Sodium: 141 mmol/L (ref 135–145)

## 2023-06-29 LAB — CBC
HCT: 32 % — ABNORMAL LOW (ref 39.0–52.0)
Hemoglobin: 10.9 g/dL — ABNORMAL LOW (ref 13.0–17.0)
MCH: 30.2 pg (ref 26.0–34.0)
MCHC: 34.1 g/dL (ref 30.0–36.0)
MCV: 88.6 fL (ref 80.0–100.0)
Platelets: 239 10*3/uL (ref 150–400)
RBC: 3.61 MIL/uL — ABNORMAL LOW (ref 4.22–5.81)
RDW: 12.4 % (ref 11.5–15.5)
WBC: 9.3 10*3/uL (ref 4.0–10.5)
nRBC: 0 % (ref 0.0–0.2)

## 2023-06-29 MED ORDER — KETOROLAC TROMETHAMINE 30 MG/ML IJ SOLN
15.0000 mg | Freq: Once | INTRAMUSCULAR | Status: AC
  Administered 2023-06-29: 15 mg via INTRAMUSCULAR

## 2023-06-29 MED ORDER — PREDNISONE 20 MG PO TABS: 40.0000 mg | ORAL_TABLET | Freq: Every day | ORAL | 0 refills | Status: AC

## 2023-06-29 MED ORDER — KETOROLAC TROMETHAMINE 30 MG/ML IJ SOLN
INTRAMUSCULAR | Status: AC
  Filled 2023-06-29: qty 1

## 2023-06-29 NOTE — ED Provider Notes (Signed)
 [facility]    CSN: [id number] Arrival date & time: 06/29/23  1135      History   Chief Complaint Chief Complaint  Patient presents with   Hand Pain    HPI Jose Cook is a 68 y.o. male.   Jose Cook is a 68 y.o. male presenting for chief complaint of left hand pain that started yesterday. Pain is associated with warmth and swelling. History of gout, states this feels like a gout attack. Denies recent intake of foods high in purines. No recent trauma/injuries to the left hand. Denies numbness/tingling distally to pain/swelling. Denies recent fevers, chills, nausea, vomiting, and body aches. Denies recent steroid use. Taking aspirin with minimal relief of pain and swelling. He has taken colchicine for gout flares in the past but states this causes diarrhea.      Past Medical History:  Diagnosis Date   Allergy    seasonal allergies   Gout    HTN (hypertension)    on meds   Hyperlipidemia    on meds   Pacemaker     Patient Active Problem List   Diagnosis Date Noted   Benign essential hypertension 06/16/2022   Encounter for other administrative examinations 06/16/2022   Hyperlipidemia 06/16/2022   Left knee pain 06/16/2022   NSVT (nonsustained ventricular tachycardia) (HCC) 05/11/2022   Pacemaker 12/27/2019   Complete heart block (HCC) 09/18/2019   Gout 07/17/2012    Past Surgical History:  Procedure Laterality Date   COLONOSCOPY  2010   normal-recall 62yrs   KNEE SURGERY Left    x 2 sx   PACEMAKER IMPLANT N/A 09/19/2019   Procedure: PACEMAKER IMPLANT;  Surgeon: Verona Goodwill, MD;  Location: [facility] INVASIVE CV LAB;  Service: Cardiovascular;  Laterality: N/A;   ROTATOR CUFF REPAIR Left        Home Medications    Prior to Admission medications   Medication Sig Start Date End Date Taking? Authorizing Provider  allopurinol  (ZYLOPRIM ) 300 MG tablet Take 300 mg by mouth daily.  04/07/18  Yes [provider]  amLODipine (NORVASC) 10  MG tablet Take 10 mg by mouth daily. 08/16/19  Yes [provider]  DULoxetine (CYMBALTA) 30 MG capsule Take 90 mg by mouth daily. 01/27/23  Yes [provider]  gabapentin  (NEURONTIN ) 300 MG capsule Take 1 capsule (300 mg total) by mouth 3 (three) times daily. Patient taking differently: Take 300 mg by mouth 3 (three) times daily as needed. 05/05/22  Yes Regal, Angus Kenning, DPM  gemfibrozil (LOPID) 600 MG tablet Take 600 mg by mouth daily. 08/25/19  Yes [provider]  losartan (COZAAR) 100 MG tablet Take 100 mg by mouth daily. 06/06/22  Yes [provider]  predniSONE  (DELTASONE ) 20 MG tablet Take 2 tablets (40 mg total) by mouth daily with breakfast for 5 days. 06/29/23 07/04/23 Yes Starlene Eaton, FNP  sildenafil (VIAGRA) 100 MG tablet Take 50-100 mg by mouth daily as needed for erectile dysfunction.  09/02/19  Yes [provider]  Menthol, Topical Analgesic, (BIOFREEZE EX) Apply 2 sprays topically 3 (three) times daily as needed (pain).    [provider]  Omega-3 Fatty Acids (FISH OIL PO) Take by mouth.    [provider]  ondansetron  (ZOFRAN ) 4 MG tablet Take 1 tablet (4 mg total) by mouth every 8 (eight) hours as needed for nausea or vomiting. 05/17/22   Regal, Angus Kenning, DPM    Family History Family History  Problem  Relation Age of Onset   Lymphoma Father 20   Colon polyps Neg Hx    Colon cancer Neg Hx    Esophageal cancer Neg Hx    Rectal cancer Neg Hx    Stomach cancer Neg Hx     Social History Social History   Tobacco Use   Smoking status: Former    Current packs/day: 0.00    Average packs/day: 1 pack/day for 20.0 years (20.0 ttl pk-yrs)    Types: Cigarettes    Start date: 09/24/1999    Quit date: 09/24/2019    Years since quitting: 3.7   Smokeless tobacco: Never  Vaping Use   Vaping status: Never Used  Substance Use Topics   Alcohol use: Yes    Comment: 6 pack every couple days   Drug use: No     Allergies    Patient has no known allergies.   Review of Systems Review of Systems Per HPI  Physical Exam Triage Vital Signs ED Triage Vitals  Encounter Vitals Group     BP 06/29/23 1219 131/73     Systolic BP Percentile --      Diastolic BP Percentile --      Pulse Rate 06/29/23 1219 (!) 101     Resp 06/29/23 1219 18     Temp 06/29/23 1219 98.4 F (36.9 C)     Temp Source 06/29/23 1219 Oral     SpO2 06/29/23 1219 97 %     Weight --      Height --      Head Circumference --      Peak Flow --      Pain Score 06/29/23 1220 8     Pain Loc --      Pain Education --      Exclude from Growth Chart --    No data found.  Updated Vital Signs BP 131/73   Pulse (!) 101   Temp 98.4 F (36.9 C) (Oral)   Resp 18   SpO2 97%   Visual Acuity Right Eye Distance:   Left Eye Distance:   Bilateral Distance:    Right Eye Near:   Left Eye Near:    Bilateral Near:     Physical Exam Vitals and nursing note reviewed.  Constitutional:      Appearance: He is not ill-appearing or toxic-appearing.  HENT:     Head: Normocephalic and atraumatic.     Right Ear: Hearing and external ear normal.     Left Ear: Hearing and external ear normal.     Nose: Nose normal.     Mouth/Throat:     Lips: Pink.  Eyes:     General: Lids are normal. Vision grossly intact. Gaze aligned appropriately.     Extraocular Movements: Extraocular movements intact.     Conjunctiva/sclera: Conjunctivae normal.  Pulmonary:     Effort: Pulmonary effort is normal.  Musculoskeletal:     Right hand: Normal.     Left hand: Swelling and tenderness present. No bony tenderness. Decreased range of motion. Normal strength (5/5 strength against resistance with flexion and extension of the fingers of the left hand distally). Normal sensation. There is no disruption of two-point discrimination. Normal capillary refill. Normal pulse.     Cervical back: Neck supple.     Comments: +2 left radial pulse, sensation and strength intact to  distal left upper extremity. Pain, redness, and swelling over the third through fifth metacarpals of the left as seen in image below. Area  of swelling is very tender and warm to palpation.   Skin:    General: Skin is warm and dry.     Capillary Refill: Capillary refill takes less than 2 seconds.     Findings: No rash.  Neurological:     General: No focal deficit present.     Mental Status: He is alert and oriented to person, place, and time. Mental status is at baseline.     Cranial Nerves: No dysarthria or facial asymmetry.  Psychiatric:        Mood and Affect: Mood normal.        Speech: Speech normal.        Behavior: Behavior normal.        Thought Content: Thought content normal.        Judgment: Judgment normal.      UC Treatments / Results  Labs (all labs ordered are listed, but only abnormal results are displayed) Labs Reviewed  BASIC METABOLIC PANEL WITH GFR  CBC    EKG   Radiology No results found.  Procedures Procedures (including critical care time)  Medications Ordered in UC Medications  ketorolac  (TORADOL ) 30 MG/ML injection 15 mg (15 mg Intramuscular Given 06/29/23 1316)    Initial Impression / Assessment and Plan / UC Course  I have reviewed the triage vital signs and the nursing notes.  Pertinent labs & imaging results that were available during my care of the patient were reviewed by me and considered in my medical decision making (see chart for details).   1. Acute gout of left hand, localized swelling on left hand Presentation is consistent with acute gout flare of the left hand.  Previous blood work reviewed (2021), renal function intact then. Basic blood work (CBC/BMP) drawn to evaluate for other causes of left hand swelling/pain and renal function.  Prednisone  40mg  every day for 5 days ordered. Considered colchicine, however this has given him diarrhea in the past so we will treat with steroid this time.  Ketorolac  15mg  IM given for acute  pain and swelling in clinic.  No NSAIDs during prednisone  administration and after ketorolac .   Follow-up with PCP recommended in 3-5 days for re-check to ensure symptoms are improving.   Counseled patient on potential for adverse effects with medications prescribed/recommended today, strict ER and return-to-clinic precautions discussed, patient verbalized understanding.    Final Clinical Impressions(s) / UC Diagnoses   Final diagnoses:  Acute gout of left hand, unspecified cause  Localized swelling on left hand     Discharge Instructions      Take prednisone  40 mg once daily for the next 5 days.  I gave you a shot of ketorolac  in the clinic which is an anti-inflammatory medication that will help with your pain.  Do not take any ibuprofen /aspirin/other NSAIDs while you take prednisone  as it can cause stomach upset.  Labs will come back in the next 1-2 days, staff will call if abnormal.   If you develop any new or worsening symptoms or if your symptoms do not start to improve, please return here or follow-up with your primary care provider. If your symptoms are severe, please go to the emergency room.   ED Prescriptions     Medication Sig Dispense Auth. Provider   predniSONE  (DELTASONE ) 20 MG tablet Take 2 tablets (40 mg total) by mouth daily with breakfast for 5 days. 10 tablet Starlene Eaton, FNP      PDMP not reviewed this encounter.   Starlene Eaton,  FNP 06/29/23 1340

## 2023-06-29 NOTE — Discharge Instructions (Addendum)
 Take prednisone  40 mg once daily for the next 5 days.  I gave you a shot of ketorolac  in the clinic which is an anti-inflammatory medication that will help with your pain.  Do not take any ibuprofen /aspirin/other NSAIDs while you take prednisone  as it can cause stomach upset.  Labs will come back in the next 1-2 days, staff will call if abnormal.   If you develop any new or worsening symptoms or if your symptoms do not start to improve, please return here or follow-up with your primary care provider. If your symptoms are severe, please go to the emergency room.

## 2023-06-29 NOTE — ED Triage Notes (Signed)
 C/O left hand gout flare-up onset yesterday. Swelling and redness noted to left hand. Has taken ?ASA without relief.

## 2023-06-30 ENCOUNTER — Ambulatory Visit (HOSPITAL_COMMUNITY): Payer: Self-pay

## 2023-06-30 MED ORDER — POTASSIUM CHLORIDE CRYS ER 20 MEQ PO TBCR: 20.0000 meq | EXTENDED_RELEASE_TABLET | Freq: Every day | ORAL | 0 refills | Status: AC

## 2023-07-01 ENCOUNTER — Ambulatory Visit: Payer: Self-pay | Admitting: Cardiology

## 2023-08-01 ENCOUNTER — Telehealth: Payer: Self-pay | Admitting: Podiatry

## 2023-08-01 NOTE — Telephone Encounter (Signed)
 Patient is requesting refill of Gabapentin  (Neurontin ) 300 MG capsule

## 2023-08-04 ENCOUNTER — Other Ambulatory Visit: Payer: Self-pay | Admitting: Podiatry

## 2023-08-04 MED ORDER — GABAPENTIN 300 MG PO CAPS: 300.0000 mg | ORAL_CAPSULE | Freq: Three times a day (TID) | ORAL | 3 refills | Status: DC

## 2023-08-04 NOTE — Telephone Encounter (Signed)
 Sent in refill

## 2023-08-16 NOTE — Progress Notes (Signed)
 Remote pacemaker transmission.

## 2023-09-20 ENCOUNTER — Ambulatory Visit (INDEPENDENT_AMBULATORY_CARE_PROVIDER_SITE_OTHER): Payer: 59

## 2023-09-20 DIAGNOSIS — I442 Atrioventricular block, complete: Secondary | ICD-10-CM | POA: Diagnosis not present

## 2023-09-22 ENCOUNTER — Ambulatory Visit: Payer: Self-pay | Admitting: Cardiology

## 2023-09-22 LAB — CUP PACEART REMOTE DEVICE CHECK
Battery Remaining Longevity: 106 mo
Battery Voltage: 3.01 V
Brady Statistic AP VP Percent: 0.01 %
Brady Statistic AP VS Percent: 0.24 %
Brady Statistic AS VP Percent: 0.03 %
Brady Statistic AS VS Percent: 99.73 %
Brady Statistic RA Percent Paced: 0.34 %
Brady Statistic RV Percent Paced: 0.03 %
Date Time Interrogation Session: 20250902024653
Implantable Lead Connection Status: 753985
Implantable Lead Connection Status: 753985
Implantable Lead Implant Date: 20210901
Implantable Lead Implant Date: 20210901
Implantable Lead Location: 753859
Implantable Lead Location: 753860
Implantable Lead Model: 5076
Implantable Lead Model: 5076
Implantable Pulse Generator Implant Date: 20210901
Lead Channel Impedance Value: 304 Ohm
Lead Channel Impedance Value: 342 Ohm
Lead Channel Impedance Value: 399 Ohm
Lead Channel Impedance Value: 437 Ohm
Lead Channel Pacing Threshold Amplitude: 0.5 V
Lead Channel Pacing Threshold Amplitude: 0.5 V
Lead Channel Pacing Threshold Pulse Width: 0.4 ms
Lead Channel Pacing Threshold Pulse Width: 0.4 ms
Lead Channel Sensing Intrinsic Amplitude: 0.375 mV
Lead Channel Sensing Intrinsic Amplitude: 0.375 mV
Lead Channel Sensing Intrinsic Amplitude: 4.75 mV
Lead Channel Sensing Intrinsic Amplitude: 4.75 mV
Lead Channel Setting Pacing Amplitude: 1.5 V
Lead Channel Setting Pacing Amplitude: 2 V
Lead Channel Setting Pacing Pulse Width: 0.4 ms
Lead Channel Setting Sensing Sensitivity: 1.2 mV
Zone Setting Status: 755011

## 2023-09-27 NOTE — Progress Notes (Signed)
 Remote PPM Transmission

## 2023-11-23 ENCOUNTER — Encounter: Payer: Self-pay | Admitting: Neurology

## 2023-12-08 ENCOUNTER — Other Ambulatory Visit: Payer: Self-pay | Admitting: Podiatry

## 2023-12-20 ENCOUNTER — Ambulatory Visit: Payer: 59

## 2023-12-20 DIAGNOSIS — I442 Atrioventricular block, complete: Secondary | ICD-10-CM | POA: Diagnosis not present

## 2023-12-21 LAB — CUP PACEART REMOTE DEVICE CHECK
Battery Remaining Longevity: 105 mo
Battery Voltage: 3.01 V
Brady Statistic AP VP Percent: 0.01 %
Brady Statistic AP VS Percent: 0.72 %
Brady Statistic AS VP Percent: 0.03 %
Brady Statistic AS VS Percent: 99.24 %
Brady Statistic RA Percent Paced: 1.02 %
Brady Statistic RV Percent Paced: 0.04 %
Date Time Interrogation Session: 20251202033943
Implantable Lead Connection Status: 753985
Implantable Lead Connection Status: 753985
Implantable Lead Implant Date: 20210901
Implantable Lead Implant Date: 20210901
Implantable Lead Location: 753859
Implantable Lead Location: 753860
Implantable Lead Model: 5076
Implantable Lead Model: 5076
Implantable Pulse Generator Implant Date: 20210901
Lead Channel Impedance Value: 285 Ohm
Lead Channel Impedance Value: 342 Ohm
Lead Channel Impedance Value: 380 Ohm
Lead Channel Impedance Value: 418 Ohm
Lead Channel Pacing Threshold Amplitude: 0.5 V
Lead Channel Pacing Threshold Amplitude: 0.625 V
Lead Channel Pacing Threshold Pulse Width: 0.4 ms
Lead Channel Pacing Threshold Pulse Width: 0.4 ms
Lead Channel Sensing Intrinsic Amplitude: 1.375 mV
Lead Channel Sensing Intrinsic Amplitude: 1.375 mV
Lead Channel Sensing Intrinsic Amplitude: 4.25 mV
Lead Channel Sensing Intrinsic Amplitude: 4.25 mV
Lead Channel Setting Pacing Amplitude: 1.5 V
Lead Channel Setting Pacing Amplitude: 2 V
Lead Channel Setting Pacing Pulse Width: 0.4 ms
Lead Channel Setting Sensing Sensitivity: 1.2 mV
Zone Setting Status: 755011

## 2023-12-22 ENCOUNTER — Ambulatory Visit: Payer: Self-pay | Admitting: Cardiology

## 2023-12-23 NOTE — Progress Notes (Signed)
 Remote PPM Transmission

## 2024-03-06 ENCOUNTER — Ambulatory Visit: Admitting: Neurology

## 2024-03-20 ENCOUNTER — Ambulatory Visit

## 2024-06-19 ENCOUNTER — Ambulatory Visit

## 2024-09-18 ENCOUNTER — Ambulatory Visit

## 2024-12-18 ENCOUNTER — Ambulatory Visit

## 2025-03-19 ENCOUNTER — Ambulatory Visit
